# Patient Record
Sex: Male | Born: 1975
Health system: Southern US, Community
[De-identification: ages and names within clinical notes are randomized; demographics above are authoritative.]

---

## 2011-07-29 ENCOUNTER — Emergency Department (HOSPITAL_COMMUNITY): Payer: No Typology Code available for payment source

## 2011-07-29 ENCOUNTER — Emergency Department (HOSPITAL_COMMUNITY)
Admission: EM | Admit: 2011-07-29 | Discharge: 2011-07-29 | Disposition: A | Discharge status: Home / Self Care | Payer: No Typology Code available for payment source | Attending: Emergency Medicine | Admitting: Emergency Medicine

## 2011-07-29 ENCOUNTER — Encounter (HOSPITAL_COMMUNITY): Payer: Self-pay | Admitting: Emergency Medicine

## 2011-07-29 DIAGNOSIS — T07XXXA Unspecified multiple injuries, initial encounter: Secondary | ICD-10-CM

## 2011-07-29 DIAGNOSIS — M79609 Pain in unspecified limb: Secondary | ICD-10-CM | POA: Insufficient documentation

## 2011-07-29 DIAGNOSIS — IMO0002 Reserved for concepts with insufficient information to code with codable children: Secondary | ICD-10-CM | POA: Insufficient documentation

## 2011-07-29 DIAGNOSIS — S61409A Unspecified open wound of unspecified hand, initial encounter: Secondary | ICD-10-CM | POA: Insufficient documentation

## 2011-07-29 DIAGNOSIS — R404 Transient alteration of awareness: Secondary | ICD-10-CM | POA: Insufficient documentation

## 2011-07-29 LAB — SAMPLE TO BLOOD BANK

## 2011-07-29 LAB — POCT I-STAT, CHEM 8
BUN: 19 mg/dL (ref 6–23)
Calcium, Ion: 1.14 mmol/L (ref 1.12–1.32)
Chloride: 106 meq/L (ref 96–112)
Creatinine, Ser: 1.3 mg/dL (ref 0.50–1.35)
Glucose, Bld: 91 mg/dL (ref 70–99)
HCT: 47 % (ref 39.0–52.0)
Hemoglobin: 16 g/dL (ref 13.0–17.0)
Potassium: 4.4 meq/L (ref 3.5–5.1)
Sodium: 143 meq/L (ref 135–145)
TCO2: 28 mmol/L (ref 0–100)

## 2011-07-29 LAB — CBC
HCT: 43.1 % (ref 39.0–52.0)
Hemoglobin: 15.7 g/dL (ref 13.0–17.0)
MCH: 32.9 pg (ref 26.0–34.0)
MCHC: 36.4 g/dL — ABNORMAL HIGH (ref 30.0–36.0)
MCV: 90.4 fL (ref 78.0–100.0)
Platelets: 257 10*3/uL (ref 150–400)
RBC: 4.77 MIL/uL (ref 4.22–5.81)
RDW: 12.1 % (ref 11.5–15.5)
WBC: 9.4 10*3/uL (ref 4.0–10.5)

## 2011-07-29 LAB — LACTIC ACID, PLASMA: Lactic Acid, Venous: 1.8 mmol/L (ref 0.5–2.2)

## 2011-07-29 LAB — DIFFERENTIAL
Basophils Absolute: 0 10*3/uL (ref 0.0–0.1)
Basophils Relative: 0 % (ref 0–1)
Eosinophils Absolute: 0.1 10*3/uL (ref 0.0–0.7)
Monocytes Relative: 4 % (ref 3–12)
Neutro Abs: 5.9 10*3/uL (ref 1.7–7.7)
Neutrophils Relative %: 63 % (ref 43–77)

## 2011-07-29 LAB — PROTIME-INR
INR: 0.96 (ref 0.00–1.49)
Prothrombin Time: 13 s (ref 11.6–15.2)

## 2011-07-29 MED ORDER — IOHEXOL 300 MG/ML  SOLN
100.0000 mL | Freq: Once | INTRAMUSCULAR | Status: AC | PRN
  Administered 2011-07-29: 100 mL via INTRAVENOUS

## 2011-07-29 MED ORDER — HYDROCODONE-ACETAMINOPHEN 5-500 MG PO TABS: 2.0000 | ORAL_TABLET | Freq: Four times a day (QID) | ORAL | Status: AC | PRN

## 2011-07-29 MED ORDER — TETANUS-DIPHTH-ACELL PERTUSSIS 5-2.5-18.5 LF-MCG/0.5 IM SUSP
0.5000 mL | Freq: Once | INTRAMUSCULAR | Status: AC
  Administered 2011-07-29: 0.5 mL via INTRAMUSCULAR
  Filled 2011-07-29: qty 0.5

## 2011-07-29 MED ORDER — "THROMBI-PAD 3""X3"" EX PADS"
MEDICATED_PAD | CUTANEOUS | Status: AC
  Administered 2011-07-29: 06:00:00
  Filled 2011-07-29: qty 1

## 2011-07-29 NOTE — ED Notes (Signed)
Report given to kelly rn

## 2011-07-29 NOTE — ED Notes (Signed)
Patient resting family at bedside. Patient states he is comfortable at present. Alert oriented.
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
## 2017-08-08 ENCOUNTER — Ambulatory Visit: Payer: BLUE CROSS/BLUE SHIELD | Admitting: Family Medicine

## 2017-08-08 ENCOUNTER — Encounter: Payer: Self-pay | Admitting: Family Medicine

## 2017-08-08 VITALS — BP 114/64 | HR 78 | Temp 98.4°F | Ht 68.0 in | Wt 161.4 lb

## 2017-08-08 DIAGNOSIS — R55 Syncope and collapse: Secondary | ICD-10-CM | POA: Diagnosis not present

## 2017-08-08 NOTE — Progress Notes (Signed)
Subjective:  Cole Griffin is a 42 y.o. male who presents today with a chief complaint of syncope and to establish care.   HPI:  Syncope, New Issue Symptoms occurred early this morning. Went to sleep around 10 or 11pm. Woke up around 2 or 3 am to help his daughter get to the bathroom. When he was waiting for his daughter, he lost consciousness and woke up on the floor. Family tried to wake him up and it took him about a minute to regain consciousness. He then went back to bed. Felt "foggy headed" after that. No nausea or vomiting. No weakness or numbness. No headache.  No preceding symptoms.  No history of syncope.  No seizure-like activity.  No urinary incontinence.  No tongue biting.  No recent medications.  He had one beer yesterday but denies any other alcohol or drug use.  ROS: Per HPI, otherwise a 10 point review of systems was performed and was negative  PMH:  The following were reviewed and entered/updated in epic: History reviewed. No pertinent past medical history. There are no active problems to display for this patient.  History reviewed. No pertinent surgical history.  No family history of sudden cardiac death or syncope.  Medications- reviewed and updated No current outpatient medications on file.   No current facility-administered medications for this visit.    Allergies-reviewed and updated No Known Allergies  Social History   Socioeconomic History  . Marital status: Married    Spouse name: None  . Number of children: None  . Years of education: None  . Highest education level: None  Social Needs  . Financial resource strain: None  . Food insecurity - worry: None  . Food insecurity - inability: None  . Transportation needs - medical: None  . Transportation needs - non-medical: None  Occupational History  . None  Tobacco Use  . Smoking status: Never Smoker  . Smokeless tobacco: Never Used  Substance and Sexual Activity  . Alcohol use: Yes   Comment: Occasionally  . Drug use: No  . Sexual activity: Yes    Partners: Female  Other Topics Concern  . None  Social History Narrative  . None   Objective:  Physical Exam: BP 114/64 (BP Location: Left Arm, Patient Position: Sitting, Cuff Size: Normal)   Pulse 78   Temp 98.4 F (36.9 C) (Oral)   Ht 5' 8"  (1.727 m)   Wt 161 lb 6.4 oz (73.2 kg)   SpO2 97%   BMI 24.54 kg/m   Gen: NAD, resting comfortably HEENT: TMs clear bilaterally.  Moist mucous membranes. CV: RRR with no murmurs appreciated Pulm: NWOB, CTAB with no crackles, wheezes, or rhonchi GI: Normal bowel sounds present. Soft, Nontender, Nondistended. MSK: No edema, cyanosis, or clubbing noted Skin: Warm, dry Neuro: Cranial nerves II through XII intact.  Strength 5 out of 5 in upper and lower extremities.  Finger-nose-finger testing intact bilaterally.  No pronator drift.  Romberg negative.  Patellar reflexes 1+ and symmetric bilaterally. Psych: Normal affect and thought content  Assessment/Plan:  Syncope No clear etiology.  It is possible that he had a sleep cycle disturbance and went into a deep sleep stage of sleep however given his lack of prodromal symptoms, we need to rule out cardiac dysrhythmia.  We will place referral to cardiology to have prolonged monitoring.  Will defer further workup such as echocardiogram to cardiology.  No evidence of seizure like activity and his neurological exam is normal-doubt primary CNS lesion.  We will check CBC, CMET, and TSH.  Strict return precautions reviewed.  [name]. [name], MD 08/08/2017 4:52 PM

## 2017-08-08 NOTE — Patient Instructions (Signed)
Your symptoms are mostly likely due to a sleep disturbance.  I would like to make sure nothing else is going on with your heart.  We will check blood work today.  Come back to see me in 6-8 weeks for a physical exam, or sooner as needed.  Take care, Dr [name]

## 2017-08-09 LAB — COMPREHENSIVE METABOLIC PANEL
ALT: 14 U/L (ref 0–53)
AST: 20 U/L (ref 0–37)
Albumin: 4.6 g/dL (ref 3.5–5.2)
Alkaline Phosphatase: 43 U/L (ref 39–117)
BUN: 21 mg/dL (ref 6–23)
CHLORIDE: 103 meq/L (ref 96–112)
CO2: 23 meq/L (ref 19–32)
CREATININE: 0.86 mg/dL (ref 0.40–1.50)
Calcium: 9.3 mg/dL (ref 8.4–10.5)
GFR: 102.85 mL/min (ref >60.00)
Glucose, Bld: 92 mg/dL (ref 70–99)
POTASSIUM: 3.9 meq/L (ref 3.5–5.1)
Sodium: 142 mEq/L (ref 135–145)
Total Bilirubin: 0.6 mg/dL (ref 0.2–1.2)
Total Protein: 7 g/dL (ref 6.0–8.3)

## 2017-08-09 LAB — CBC
HEMATOCRIT: 43.7 % (ref 39.0–52.0)
Hemoglobin: 15 g/dL (ref 13.0–17.0)
MCHC: 34.4 g/dL (ref 30.0–36.0)
MCV: 93.7 fl (ref 78.0–100.0)
Platelets: 229 10*3/uL (ref 150.0–400.0)
RBC: 4.66 Mil/uL (ref 4.22–5.81)
RDW: 12.3 % (ref 11.5–15.5)
WBC: 7.7 10*3/uL (ref 4.0–10.5)

## 2017-08-09 LAB — TSH: TSH: 0.4 u[IU]/mL (ref 0.35–4.50)

## 2017-08-10 NOTE — Progress Notes (Signed)
Labs all normal.  Please inform patient.  No changes needed for his treatment plan.  We are waiting on him to see cardiology.

## 2017-09-12 ENCOUNTER — Ambulatory Visit (INDEPENDENT_AMBULATORY_CARE_PROVIDER_SITE_OTHER): Payer: BLUE CROSS/BLUE SHIELD | Admitting: Family Medicine

## 2017-09-12 ENCOUNTER — Encounter: Payer: Self-pay | Admitting: Family Medicine

## 2017-09-12 VITALS — BP 118/68 | HR 82 | Temp 98.4°F | Ht 68.0 in | Wt 161.2 lb

## 2017-09-12 DIAGNOSIS — Z114 Encounter for screening for human immunodeficiency virus [HIV]: Secondary | ICD-10-CM

## 2017-09-12 DIAGNOSIS — Z13220 Encounter for screening for lipoid disorders: Secondary | ICD-10-CM

## 2017-09-12 DIAGNOSIS — Z0000 Encounter for general adult medical examination without abnormal findings: Secondary | ICD-10-CM | POA: Diagnosis not present

## 2017-09-12 LAB — LIPID PANEL
CHOL/HDL RATIO: 3
CHOLESTEROL: 166 mg/dL (ref 0–200)
HDL: 54.9 mg/dL (ref >39.00)
LDL CALC: 91 mg/dL (ref 0–99)
NONHDL: 111.19
Triglycerides: 99 mg/dL (ref 0.0–149.0)
VLDL: 19.8 mg/dL (ref 0.0–40.0)

## 2017-09-12 NOTE — Patient Instructions (Signed)
Preventive Care 40-64 Years, Male Preventive care refers to lifestyle choices and visits with your health care provider that can promote health and wellness. What does preventive care include?  A yearly physical exam. This is also called an annual well check.  Dental exams once or twice a year.  Routine eye exams. Ask your health care provider how often you should have your eyes checked.  Personal lifestyle choices, including: ? Daily care of your teeth and gums. ? Regular physical activity. ? Eating a healthy diet. ? Avoiding tobacco and drug use. ? Limiting alcohol use. ? Practicing safe sex. ? Taking low-dose aspirin every day starting at age 44. What happens during an annual well check? The services and screenings done by your health care provider during your annual well check will depend on your age, overall health, lifestyle risk factors, and family history of disease. Counseling Your health care provider may ask you questions about your:  Alcohol use.  Tobacco use.  Drug use.  Emotional well-being.  Home and relationship well-being.  Sexual activity.  Eating habits.  Work and work Statistician.  Screening You may have the following tests or measurements:  Height, weight, and BMI.  Blood pressure.  Lipid and cholesterol levels. These may be checked every 5 years, or more frequently if you are over 44 years old.  Skin check.  Lung cancer screening. You may have this screening every year starting at age 44 if you have a 30-pack-year history of smoking and currently smoke or have quit within the past 15 years.  Fecal occult blood test (FOBT) of the stool. You may have this test every year starting at age 44.  Flexible sigmoidoscopy or colonoscopy. You may have a sigmoidoscopy every 5 years or a colonoscopy every 10 years starting at age 44.  Prostate cancer screening. Recommendations will vary depending on your family history and other risks.  Hepatitis C  blood test.  Hepatitis B blood test.  Sexually transmitted disease (STD) testing.  Diabetes screening. This is done by checking your blood sugar (glucose) after you have not eaten for a while (fasting). You may have this done every 1-3 years.  Discuss your test results, treatment options, and if necessary, the need for more tests with your health care provider. Vaccines Your health care provider may recommend certain vaccines, such as:  Influenza vaccine. This is recommended every year.  Tetanus, diphtheria, and acellular pertussis (Tdap, Td) vaccine. You may need a Td booster every 10 years.  Varicella vaccine. You may need this if you have not been vaccinated.  Zoster vaccine. You may need this after age 44.  Measles, mumps, and rubella (MMR) vaccine. You may need at least one dose of MMR if you were born in 1957 or later. You may also need a second dose.  Pneumococcal 13-valent conjugate (PCV13) vaccine. You may need this if you have certain conditions and have not been vaccinated.  Pneumococcal polysaccharide (PPSV23) vaccine. You may need one or two doses if you smoke cigarettes or if you have certain conditions.  Meningococcal vaccine. You may need this if you have certain conditions.  Hepatitis A vaccine. You may need this if you have certain conditions or if you travel or work in places where you may be exposed to hepatitis A.  Hepatitis B vaccine. You may need this if you have certain conditions or if you travel or work in places where you may be exposed to hepatitis B.  Haemophilus influenzae type b (Hib) vaccine.  You may need this if you have certain risk factors.  Talk to your health care provider about which screenings and vaccines you need and how often you need them. This information is not intended to replace advice given to you by your health care provider. Make sure you discuss any questions you have with your health care provider. Document Released: 07/31/2015  Document Revised: 03/23/2016 Document Reviewed: 05/05/2015 Elsevier Interactive Patient Education  [employer].

## 2017-09-12 NOTE — Progress Notes (Signed)
Subjective:  Cole Griffin is a 42 y.o. male who presents today for his annual comprehensive physical exam.    HPI:  He has no acute complaints today.   Lifestyle Diet: No specific diet. Tries to eat balanced diet.  Exercise: Runs 7 miles twice weekly. Rides bikes a couple times for per month.   Depression screen PHQ 2/9 08/08/2017  Decreased Interest 0  Down, Depressed, Hopeless 0  PHQ - 2 Score 0   Health Maintenance Due  Topic Date Due  . HIV Screening  12/22/1988    ROS: A complete review of systems was negative.   PMH:  The following were reviewed and entered/updated in epic: History reviewed. No pertinent past medical history. There are no active problems to display for this patient.  History reviewed. No pertinent surgical history.  Family History  Problem Relation Age of Onset  . Sudden Cardiac Death Neg Hx    Medications- reviewed and updated No current outpatient medications on file.   No current facility-administered medications for this visit.    Allergies-reviewed and updated No Known Allergies  Social History   Socioeconomic History  . Marital status: Married    Spouse name: None  . Number of children: None  . Years of education: None  . Highest education level: None  Social Needs  . Financial resource strain: None  . Food insecurity - worry: None  . Food insecurity - inability: None  . Transportation needs - medical: None  . Transportation needs - non-medical: None  Occupational History  . None  Tobacco Use  . Smoking status: Never Smoker  . Smokeless tobacco: Never Used  Substance and Sexual Activity  . Alcohol use: Yes    Comment: Occasionally  . Drug use: No  . Sexual activity: Yes    Partners: Female  Other Topics Concern  . None  Social History Narrative  . None   Objective:  Physical Exam: BP 118/68 (BP Location: Right Arm, Patient Position: Sitting, Cuff Size: Normal)   Pulse 82   Temp 98.4 F (36.9 C) (Oral)   Ht  5' 8"  (1.727 m)   Wt 161 lb 3.2 oz (73.1 kg)   SpO2 96%   BMI 24.51 kg/m   Body mass index is 24.51 kg/m. Wt Readings from Last 3 Encounters:  09/12/17 161 lb 3.2 oz (73.1 kg)  08/08/17 161 lb 6.4 oz (73.2 kg)   Gen: NAD, resting comfortably HEENT: TMs normal bilaterally. OP clear. No thyromegaly noted.  CV: RRR with no murmurs appreciated Pulm: NWOB, CTAB with no crackles, wheezes, or rhonchi GI: Normal bowel sounds present. Soft, Nontender, Nondistended. MSK: no edema, cyanosis, or clubbing noted Skin: warm, dry Neuro: CN2-12 grossly intact. Strength 5/5 in upper and lower extremities. Reflexes symmetric and intact bilaterally.  Psych: Normal affect and thought content  Assessment/Plan:  Preventative Healthcare: Check lipid panel and HIV antibody.   Patient Counseling:  -Nutrition: Stressed importance of moderation in sodium/caffeine intake, saturated fat and cholesterol, caloric balance, sufficient intake of fresh fruits, vegetables, and fiber.  -Stressed the importance of regular exercise.   -Substance Abuse: Discussed cessation/primary prevention of tobacco, alcohol, or other drug use; driving or other dangerous activities under the influence; availability of treatment for abuse.   -Injury prevention: Discussed safety belts, safety helmets, smoke detector, smoking near bedding or upholstery.   -Sexuality: Discussed sexually transmitted diseases, partner selection, use of condoms, avoidance of unintended pregnancy and contraceptive alternatives.   -Dental health: Discussed importance of regular tooth  brushing, flossing, and dental visits.  -Health maintenance and immunizations reviewed. Please refer to Health maintenance section.  Return to care in 1 year for next preventative visit.   [name]. [name], MD 09/12/2017 8:49 AM

## 2017-09-13 LAB — HIV ANTIBODY (ROUTINE TESTING W REFLEX): HIV 1&2 Ab, 4th Generation: NONREACTIVE

## 2017-09-14 NOTE — Progress Notes (Signed)
Dr Marigene Ehlers interpretation of your lab work:  Your HIV test and hepatitis C tests are negative.    If you have any additional questions, please give [place] a call or send [place] a message through [employer].  Take care, Dr [name]

## 2018-09-13 ENCOUNTER — Encounter: Payer: BLUE CROSS/BLUE SHIELD | Admitting: Family Medicine

## 2018-09-19 ENCOUNTER — Ambulatory Visit (INDEPENDENT_AMBULATORY_CARE_PROVIDER_SITE_OTHER): Payer: 59 | Admitting: Family Medicine

## 2018-09-19 ENCOUNTER — Encounter: Payer: Self-pay | Admitting: Family Medicine

## 2018-09-19 VITALS — BP 118/72 | HR 63 | Temp 98.4°F | Ht 68.0 in | Wt 159.0 lb

## 2018-09-19 DIAGNOSIS — Z0000 Encounter for general adult medical examination without abnormal findings: Secondary | ICD-10-CM

## 2018-09-19 NOTE — Patient Instructions (Signed)
It was very nice to see you today!  Keep up the good work!  Come back in 1 year for your next physical or sooner as needed.  Take care, Dr [name]    Preventive Care 40-64 Years, Male Preventive care refers to lifestyle choices and visits with your health care provider that can promote health and wellness. What does preventive care include?   A yearly physical exam. This is also called an annual well check.  Dental exams once or twice a year.  Routine eye exams. Ask your health care provider how often you should have your eyes checked.  Personal lifestyle choices, including: ? Daily care of your teeth and gums. ? Regular physical activity. ? Eating a healthy diet. ? Avoiding tobacco and drug use. ? Limiting alcohol use. ? Practicing safe sex. ? Taking low-dose aspirin every day starting at age 45. What happens during an annual well check? The services and screenings done by your health care provider during your annual well check will depend on your age, overall health, lifestyle risk factors, and family history of disease. Counseling Your health care provider may ask you questions about your:  Alcohol use.  Tobacco use.  Drug use.  Emotional well-being.  Home and relationship well-being.  Sexual activity.  Eating habits.  Work and work Statistician. Screening You may have the following tests or measurements:  Height, weight, and BMI.  Blood pressure.  Lipid and cholesterol levels. These may be checked every 5 years, or more frequently if you are over 45 years old.  Skin check.  Lung cancer screening. You may have this screening every year starting at age 45 if you have a 30-pack-year history of smoking and currently smoke or have quit within the past 15 years.  Colorectal cancer screening. All adults should have this screening starting at age 45 and continuing until age 29. Your health care provider may recommend screening at age 59. You will have tests  every 1-10 years, depending on your results and the type of screening test. People at increased risk should start screening at an earlier age. Screening tests may include: ? Guaiac-based fecal occult blood testing. ? Fecal immunochemical test (FIT). ? Stool DNA test. ? Virtual colonoscopy. ? Sigmoidoscopy. During this test, a flexible tube with a tiny camera (sigmoidoscope) is used to examine your rectum and lower colon. The sigmoidoscope is inserted through your anus into your rectum and lower colon. ? Colonoscopy. During this test, a long, thin, flexible tube with a tiny camera (colonoscope) is used to examine your entire colon and rectum.  Prostate cancer screening. Recommendations will vary depending on your family history and other risks.  Hepatitis C blood test.  Hepatitis B blood test.  Sexually transmitted disease (STD) testing.  Diabetes screening. This is done by checking your blood sugar (glucose) after you have not eaten for a while (fasting). You may have this done every 1-3 years. Discuss your test results, treatment options, and if necessary, the need for more tests with your health care provider. Vaccines Your health care provider may recommend certain vaccines, such as:  Influenza vaccine. This is recommended every year.  Tetanus, diphtheria, and acellular pertussis (Tdap, Td) vaccine. You may need a Td booster every 10 years.  Varicella vaccine. You may need this if you have not been vaccinated.  Zoster vaccine. You may need this after age 45.  Measles, mumps, and rubella (MMR) vaccine. You may need at least one dose of MMR if you were born  in 1957 or later. You may also need a second dose.  Pneumococcal 13-valent conjugate (PCV13) vaccine. You may need this if you have certain conditions and have not been vaccinated.  Pneumococcal polysaccharide (PPSV23) vaccine. You may need one or two doses if you smoke cigarettes or if you have certain  conditions.  Meningococcal vaccine. You may need this if you have certain conditions.  Hepatitis A vaccine. You may need this if you have certain conditions or if you travel or work in places where you may be exposed to hepatitis A.  Hepatitis B vaccine. You may need this if you have certain conditions or if you travel or work in places where you may be exposed to hepatitis B.  Haemophilus influenzae type b (Hib) vaccine. You may need this if you have certain risk factors. Talk to your health care provider about which screenings and vaccines you need and how often you need them. This information is not intended to replace advice given to you by your health care provider. Make sure you discuss any questions you have with your health care provider. Document Released: 07/31/2015 Document Revised: 08/24/2017 Document Reviewed: 05/05/2015 Elsevier Interactive Patient Education  2019 [employer].

## 2018-09-19 NOTE — Progress Notes (Signed)
Chief Complaint:  Cole Griffin is a 43 y.o. male who presents today for his annual comprehensive physical exam.    Assessment/Plan:  Healthy 43 year old man doing well.   Preventative Healthcare: UTD on vaccines and screenings.   Patient Counseling(The following topics were reviewed and/or handout was given):  -Nutrition: Stressed importance of moderation in sodium/caffeine intake, saturated fat and cholesterol, caloric balance, sufficient intake of fresh fruits, vegetables, and fiber.  -Stressed the importance of regular exercise.   -Substance Abuse: Discussed cessation/primary prevention of tobacco, alcohol, or other drug use; driving or other dangerous activities under the influence; availability of treatment for abuse.   -Injury prevention: Discussed safety belts, safety helmets, smoke detector, smoking near bedding or upholstery.   -Sexuality: Discussed sexually transmitted diseases, partner selection, use of condoms, avoidance of unintended pregnancy and contraceptive alternatives.   -Dental health: Discussed importance of regular tooth brushing, flossing, and dental visits.  -Health maintenance and immunizations reviewed. Please refer to Health maintenance section.  Return to care in 1 year for next preventative visit.     Subjective:  HPI:  He has no acute complaints today.   Lifestyle Diet: Tries to eat a healthy, balanced diet.  Exercise: Runs about 12 miles per week.   Depression screen [facility] 2/9 09/19/2018  Decreased Interest 0  Down, Depressed, Hopeless 0  PHQ - 2 Score 0   ROS: A complete review of systems was negative.   PMH:  The following were reviewed and entered/updated in epic: History reviewed. No pertinent past medical history. There are no active problems to display for this patient.  History reviewed. No pertinent surgical history.  Family History  Problem Relation Age of Onset  . Sudden Cardiac Death Neg Hx    Medications- reviewed and  updated No current outpatient medications on file.   No current facility-administered medications for this visit.    Allergies-reviewed and updated No Known Allergies  Social History   Socioeconomic History  . Marital status: Married    Spouse name: Not on file  . Number of children: Not on file  . Years of education: Not on file  . Highest education level: Not on file  Occupational History  . Not on file  Social Needs  . Financial resource strain: Not on file  . Food insecurity:    Worry: Not on file    Inability: Not on file  . Transportation needs:    Medical: Not on file    Non-medical: Not on file  Tobacco Use  . Smoking status: Never Smoker  . Smokeless tobacco: Never Used  Substance and Sexual Activity  . Alcohol use: Yes    Comment: Occasionally  . Drug use: No  . Sexual activity: Yes    Partners: Female  Lifestyle  . Physical activity:    Days per week: Not on file    Minutes per session: Not on file  . Stress: Not on file  Relationships  . Social connections:    Talks on phone: Not on file    Gets together: Not on file    Attends religious service: Not on file    Active member of club or organization: Not on file    Attends meetings of clubs or organizations: Not on file    Relationship status: Not on file  Other Topics Concern  . Not on file  Social History Narrative  . Not on file        Objective:  Physical Exam: BP 118/72 (  BP Location: Left Arm, Patient Position: Sitting, Cuff Size: Normal)   Pulse 63   Temp 98.4 F (36.9 C) (Oral)   Ht 5' 8"  (1.727 m)   Wt 159 lb (72.1 kg)   SpO2 99%   BMI 24.18 kg/m   Body mass index is 24.18 kg/m. Wt Readings from Last 3 Encounters:  09/19/18 159 lb (72.1 kg)  09/12/17 161 lb 3.2 oz (73.1 kg)  08/08/17 161 lb 6.4 oz (73.2 kg)   Gen: NAD, resting comfortably HEENT: TMs normal bilaterally. OP clear. No thyromegaly noted.  CV: RRR with no murmurs appreciated Pulm: NWOB, CTAB with no crackles,  wheezes, or rhonchi GI: Normal bowel sounds present. Soft, Nontender, Nondistended. MSK: no edema, cyanosis, or clubbing noted Skin: warm, dry Neuro: CN2-12 grossly intact. Strength 5/5 in upper and lower extremities. Reflexes symmetric and intact bilaterally.  Psych: Normal affect and thought content     Caleb M. [name], MD 09/19/2018 9:17 AM

## 2019-09-25 ENCOUNTER — Other Ambulatory Visit: Payer: Self-pay

## 2019-09-25 ENCOUNTER — Ambulatory Visit (INDEPENDENT_AMBULATORY_CARE_PROVIDER_SITE_OTHER): Payer: 59 | Admitting: Family Medicine

## 2019-09-25 ENCOUNTER — Encounter: Payer: Self-pay | Admitting: Family Medicine

## 2019-09-25 VITALS — BP 106/68 | HR 63 | Temp 97.6°F | Ht 68.0 in | Wt 156.2 lb

## 2019-09-25 DIAGNOSIS — Z1211 Encounter for screening for malignant neoplasm of colon: Secondary | ICD-10-CM

## 2019-09-25 DIAGNOSIS — Z0000 Encounter for general adult medical examination without abnormal findings: Secondary | ICD-10-CM | POA: Diagnosis not present

## 2019-09-25 NOTE — Progress Notes (Signed)
Chief Complaint:  Cole Griffin is a 44 y.o. male who presents today for his annual comprehensive physical exam.    Assessment/Plan:  Healthy 44 year old male.   Preventative Healthcare: We will order Cologuard for colon cancer screening.  Up-to-date on other vaccines and screenings.  Patient Counseling(The following topics were reviewed and/or handout was given):  -Nutrition: Stressed importance of moderation in sodium/caffeine intake, saturated fat and cholesterol, caloric balance, sufficient intake of fresh fruits, vegetables, and fiber.  -Stressed the importance of regular exercise.   -Substance Abuse: Discussed cessation/primary prevention of tobacco, alcohol, or other drug use; driving or other dangerous activities under the influence; availability of treatment for abuse.   -Injury prevention: Discussed safety belts, safety helmets, smoke detector, smoking near bedding or upholstery.   -Sexuality: Discussed sexually transmitted diseases, partner selection, use of condoms, avoidance of unintended pregnancy and contraceptive alternatives.   -Dental health: Discussed importance of regular tooth brushing, flossing, and dental visits.  -Health maintenance and immunizations reviewed. Please refer to Health maintenance section.  Return to care in 1 year for next preventative visit.     Subjective:  HPI:  He has no acute complaints today.   Lifestyle Diet: Balanced. Plenty of fruits and vegetables. Intermittent fasting.  Exercise: Runs daily.   Depression screen PHQ 2/9 09/19/2018  Decreased Interest 0  Down, Depressed, Hopeless 0  PHQ - 2 Score 0   There are no preventive care reminders to display for this patient.   ROS: A complete review of systems was negative.   PMH:  The following were reviewed and entered/updated in epic: History reviewed. No pertinent past medical history. There are no problems to display for this patient.  History reviewed. No pertinent surgical  history.  Family History  Problem Relation Age of Onset  . Sudden Cardiac Death Neg Hx     Medications- reviewed and updated No current outpatient medications on file.   No current facility-administered medications for this visit.    Allergies-reviewed and updated No Known Allergies  Social History   Socioeconomic History  . Marital status: Married    Spouse name: Not on file  . Number of children: Not on file  . Years of education: Not on file  . Highest education level: Not on file  Occupational History  . Not on file  Tobacco Use  . Smoking status: Never Smoker  . Smokeless tobacco: Never Used  Substance and Sexual Activity  . Alcohol use: Yes    Comment: Occasionally  . Drug use: No  . Sexual activity: Yes    Partners: Female  Other Topics Concern  . Not on file  Social History Narrative  . Not on file   Social Determinants of Health   Financial Resource Strain:   . Difficulty of Paying Living Expenses: Not on file  Food Insecurity:   . Worried About Charity fundraiser in the Last Year: Not on file  . Ran Out of Food in the Last Year: Not on file  Transportation Needs:   . Lack of Transportation (Medical): Not on file  . Lack of Transportation (Non-Medical): Not on file  Physical Activity:   . Days of Exercise per Week: Not on file  . Minutes of Exercise per Session: Not on file  Stress:   . Feeling of Stress : Not on file  Social Connections:   . Frequency of Communication with Friends and Family: Not on file  . Frequency of Social Gatherings with Friends and  Family: Not on file  . Attends Religious Services: Not on file  . Active Member of Clubs or Organizations: Not on file  . Attends Archivist Meetings: Not on file  . Marital Status: Not on file        Objective:  Physical Exam: BP 106/68   Pulse 63   Temp 97.6 F (36.4 C)   Ht 5' 8"  (1.727 m)   Wt 156 lb 4 oz (70.9 kg)   SpO2 98%   BMI 23.76 kg/m   Body mass index is  23.76 kg/m. Wt Readings from Last 3 Encounters:  09/25/19 156 lb 4 oz (70.9 kg)  09/19/18 159 lb (72.1 kg)  09/12/17 161 lb 3.2 oz (73.1 kg)   Gen: NAD, resting comfortably HEENT: TMs normal bilaterally. OP clear. No thyromegaly noted.  CV: RRR with no murmurs appreciated Pulm: NWOB, CTAB with no crackles, wheezes, or rhonchi GI: Normal bowel sounds present. Soft, Nontender, Nondistended. MSK: no edema, cyanosis, or clubbing noted Skin: warm, dry Neuro: CN2-12 grossly intact. Strength 5/5 in upper and lower extremities. Reflexes symmetric and intact bilaterally.  Psych: Normal affect and thought content     Kerstin Crusoe M. [name], MD 09/25/2019 9:14 AM

## 2019-09-25 NOTE — Patient Instructions (Addendum)
It was very nice to see you today!  We will get you set up to have a cologuard test.   Come back in 1 year for your next physical, or sooner if needed.   Take care, Dr [name]  Please try these tips to maintain a healthy lifestyle:   Eat at least 3 REAL meals and 1-2 snacks per day.  Aim for no more than 5 hours between eating.  If you eat breakfast, please do so within one hour of getting up.    Each meal should contain half fruits/vegetables, one quarter protein, and one quarter carbs (no bigger than a computer mouse)   Cut down on sweet beverages. This includes juice, soda, and sweet tea.     Drink at least 1 glass of water with each meal and aim for at least 8 glasses per day   Exercise at least 150 minutes every week.    Preventive Care 3-69 Years Old, Male Preventive care refers to lifestyle choices and visits with your health care provider that can promote health and wellness. This includes:  A yearly physical exam. This is also called an annual well check.  Regular dental and eye exams.  Immunizations.  Screening for certain conditions.  Healthy lifestyle choices, such as eating a healthy diet, getting regular exercise, not using drugs or products that contain nicotine and tobacco, and limiting alcohol use. What can I expect for my preventive care visit? Physical exam Your health care provider will check:  Height and weight. These may be used to calculate body mass index (BMI), which is a measurement that tells if you are at a healthy weight.  Heart rate and blood pressure.  Your skin for abnormal spots. Counseling Your health care provider may ask you questions about:  Alcohol, tobacco, and drug use.  Emotional well-being.  Home and relationship well-being.  Sexual activity.  Eating habits.  Work and work Statistician. What immunizations do I need?  Influenza (flu) vaccine  This is recommended every year. Tetanus, diphtheria, and pertussis  (Tdap) vaccine  You may need a Td booster every 10 years. Varicella (chickenpox) vaccine  You may need this vaccine if you have not already been vaccinated. Zoster (shingles) vaccine  You may need this after age 27. Measles, mumps, and rubella (MMR) vaccine  You may need at least one dose of MMR if you were born in 1957 or later. You may also need a second dose. Pneumococcal conjugate (PCV13) vaccine  You may need this if you have certain conditions and were not previously vaccinated. Pneumococcal polysaccharide (PPSV23) vaccine  You may need one or two doses if you smoke cigarettes or if you have certain conditions. Meningococcal conjugate (MenACWY) vaccine  You may need this if you have certain conditions. Hepatitis A vaccine  You may need this if you have certain conditions or if you travel or work in places where you may be exposed to hepatitis A. Hepatitis B vaccine  You may need this if you have certain conditions or if you travel or work in places where you may be exposed to hepatitis B. Haemophilus influenzae type b (Hib) vaccine  You may need this if you have certain risk factors. Human papillomavirus (HPV) vaccine  If recommended by your health care provider, you may need three doses over 6 months. You may receive vaccines as individual doses or as more than one vaccine together in one shot (combination vaccines). Talk with your health care provider about the risks and benefits  of combination vaccines. What tests do I need? Blood tests  Lipid and cholesterol levels. These may be checked every 5 years, or more frequently if you are over 52 years old.  Hepatitis C test.  Hepatitis B test. Screening  Lung cancer screening. You may have this screening every year starting at age 89 if you have a 30-pack-year history of smoking and currently smoke or have quit within the past 15 years.  Prostate cancer screening. Recommendations will vary depending on your family  history and other risks.  Colorectal cancer screening. All adults should have this screening starting at age 40 and continuing until age 38. Your health care provider may recommend screening at age 39 if you are at increased risk. You will have tests every 1-10 years, depending on your results and the type of screening test.  Diabetes screening. This is done by checking your blood sugar (glucose) after you have not eaten for a while (fasting). You may have this done every 1-3 years.  Sexually transmitted disease (STD) testing. Follow these instructions at home: Eating and drinking  Eat a diet that includes fresh fruits and vegetables, whole grains, lean protein, and low-fat dairy products.  Take vitamin and mineral supplements as recommended by your health care provider.  Do not drink alcohol if your health care provider tells you not to drink.  If you drink alcohol: ? Limit how much you have to 0-2 drinks a day. ? Be aware of how much alcohol is in your drink. In the [place] one drink equals one 12 oz bottle of beer (355 mL), one 5 oz glass of wine (148 mL), or one 1 oz glass of hard liquor (44 mL). Lifestyle  Take daily care of your teeth and gums.  Stay active. Exercise for at least 30 minutes on 5 or more days each week.  Do not use any products that contain nicotine or tobacco, such as cigarettes, e-cigarettes, and chewing tobacco. If you need help quitting, ask your health care provider.  If you are sexually active, practice safe sex. Use a condom or other form of protection to prevent STIs (sexually transmitted infections).  Talk with your health care provider about taking a low-dose aspirin every day starting at age 48. What's next?  Go to your health care provider once a year for a well check visit.  Ask your health care provider how often you should have your eyes and teeth checked.  Stay up to date on all vaccines. This information is not intended to replace advice  given to you by your health care provider. Make sure you discuss any questions you have with your health care provider. Document Revised: 06/28/2018 Document Reviewed: 06/28/2018 Elsevier Patient Education  2020 [employer].

## 2019-10-15 ENCOUNTER — Telehealth: Payer: Self-pay

## 2019-10-15 ENCOUNTER — Encounter: Payer: Self-pay | Admitting: Family Medicine

## 2019-10-15 LAB — COLOGUARD
COLOGUARD: NEGATIVE
Cologuard: NEGATIVE

## 2019-10-15 NOTE — Telephone Encounter (Signed)
Cologuard Negative

## 2019-10-15 NOTE — Telephone Encounter (Signed)
Patient notified

## 2020-05-25 ENCOUNTER — Ambulatory Visit: Payer: 59 | Admitting: Family Medicine

## 2020-06-08 ENCOUNTER — Ambulatory Visit: Payer: 59 | Admitting: Family Medicine

## 2020-06-08 ENCOUNTER — Other Ambulatory Visit: Payer: Self-pay

## 2020-06-08 ENCOUNTER — Encounter: Payer: Self-pay | Admitting: Family Medicine

## 2020-06-08 VITALS — BP 103/67 | HR 60 | Temp 97.9°F | Ht 68.0 in | Wt 159.4 lb

## 2020-06-08 DIAGNOSIS — R350 Frequency of micturition: Secondary | ICD-10-CM

## 2020-06-08 DIAGNOSIS — Z125 Encounter for screening for malignant neoplasm of prostate: Secondary | ICD-10-CM | POA: Diagnosis not present

## 2020-06-08 NOTE — Patient Instructions (Signed)
It was very nice to see you today!  I do not think you have prostate cancer. We will check labs to make sure nothing else is going on.  Take care, Dr [name]  Please try these tips to maintain a healthy lifestyle:   Eat at least 3 REAL meals and 1-2 snacks per day.  Aim for no more than 5 hours between eating.  If you eat breakfast, please do so within one hour of getting up.    Each meal should contain half fruits/vegetables, one quarter protein, and one quarter carbs (no bigger than a computer mouse)   Cut down on sweet beverages. This includes juice, soda, and sweet tea.     Drink at least 1 glass of water with each meal and aim for at least 8 glasses per day   Exercise at least 150 minutes every week.

## 2020-06-08 NOTE — Progress Notes (Signed)
   Keawe Marcello is a 44 y.o. male who presents today for an office visit.  Assessment/Plan:  Urinary frequency No red flags.  Will check PSA and UA to rule out prostate cancer and UTI or other possible etiologies..  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    Subjective:  HPI:  More increased urinary frequency. He thinks it is due to polydipsia. Urinates every 2 hours. Does not wake up at night to urinate. No dysuria.        Objective:  Physical Exam: BP 103/67   Pulse 60   Temp 97.9 F (36.6 C) (Temporal)   Ht 5' 8"  (1.727 m)   Wt 159 lb 6.4 oz (72.3 kg)   SpO2 99%   BMI 24.24 kg/m   Gen: No acute distress, resting comfortably Neuro: Grossly normal, moves all extremities Psych: Normal affect and thought content      Tyreece Gelles M. [name], MD 06/08/2020 8:13 AM

## 2020-06-15 ENCOUNTER — Other Ambulatory Visit: Payer: 59

## 2020-09-30 ENCOUNTER — Ambulatory Visit (INDEPENDENT_AMBULATORY_CARE_PROVIDER_SITE_OTHER): Payer: 59 | Admitting: Family Medicine

## 2020-09-30 ENCOUNTER — Other Ambulatory Visit: Payer: Self-pay

## 2020-09-30 ENCOUNTER — Encounter: Payer: Self-pay | Admitting: Family Medicine

## 2020-09-30 VITALS — BP 110/72 | HR 74 | Temp 97.8°F | Ht 68.0 in | Wt 158.2 lb

## 2020-09-30 DIAGNOSIS — Z0001 Encounter for general adult medical examination with abnormal findings: Secondary | ICD-10-CM

## 2020-09-30 DIAGNOSIS — J3489 Other specified disorders of nose and nasal sinuses: Secondary | ICD-10-CM

## 2020-09-30 MED ORDER — AZELASTINE HCL 0.1 % NA SOLN: 2.0000 | Freq: Two times a day (BID) | NASAL | 12 refills | Status: DC

## 2020-09-30 NOTE — Progress Notes (Signed)
Chief Complaint:  Cole Griffin is a 45 y.o. male who presents today for his annual comprehensive physical exam.    Assessment/Plan:  New/Acute Problems: Rhinorrhea Likely seasonal allergies.  Home Covid test negative.  No red flags.  Start Astelin nasal spray.  Discussed reasons to return to care.  Preventative Healthcare: UTD on vaccines and screenings. Cologuard negative last year. Will repeat labs next year and cologuard in 2024.   Patient Counseling(The following topics were reviewed and/or handout was given):  -Nutrition: Stressed importance of moderation in sodium/caffeine intake, saturated fat and cholesterol, caloric balance, sufficient intake of fresh fruits, vegetables, and fiber.  -Stressed the importance of regular exercise.   -Substance Abuse: Discussed cessation/primary prevention of tobacco, alcohol, or other drug use; driving or other dangerous activities under the influence; availability of treatment for abuse.   -Injury prevention: Discussed safety belts, safety helmets, smoke detector, smoking near bedding or upholstery.   -Sexuality: Discussed sexually transmitted diseases, partner selection, use of condoms, avoidance of unintended pregnancy and contraceptive alternatives.   -Dental health: Discussed importance of regular tooth brushing, flossing, and dental visits.  -Health maintenance and immunizations reviewed. Please refer to Health maintenance section.  Return to care in 1 year for next preventative visit.     Subjective:  HPI:  He has no acute complaints today.   Lifestyle Diet: Balanced. Plenty of fruits and vegetables. Intermittent fasting.  Exercise: Likes running.   Depression screen PHQ 2/9 06/08/2020  Decreased Interest 0  Down, Depressed, Hopeless 0  PHQ - 2 Score 0    Health Maintenance Due  Topic Date Due  . Hepatitis C Screening  Never done  . COVID-19 Vaccine (2 - Pfizer 3-dose series) 08/06/2020     ROS: Per HPI, otherwise a  complete review of systems was negative.   PMH:  The following were reviewed and entered/updated in epic: History reviewed. No pertinent past medical history. There are no problems to display for this patient.  History reviewed. No pertinent surgical history.  Family History  Problem Relation Age of Onset  . Sudden Cardiac Death Neg Hx     Medications- reviewed and updated Current Outpatient Medications  Medication Sig Dispense Refill  . azelastine (ASTELIN) 0.1 % nasal spray Place 2 sprays into both nostrils 2 (two) times daily. 30 mL 12   No current facility-administered medications for this visit.    Allergies-reviewed and updated No Known Allergies  Social History   Socioeconomic History  . Marital status: Married    Spouse name: Not on file  . Number of children: Not on file  . Years of education: Not on file  . Highest education level: Not on file  Occupational History  . Not on file  Tobacco Use  . Smoking status: Never Smoker  . Smokeless tobacco: Never Used  Substance and Sexual Activity  . Alcohol use: Yes    Comment: Occasionally  . Drug use: No  . Sexual activity: Yes    Partners: Female  Other Topics Concern  . Not on file  Social History Narrative  . Not on file   Social Determinants of Health   Financial Resource Strain: Not on file  Food Insecurity: Not on file  Transportation Needs: Not on file  Physical Activity: Not on file  Stress: Not on file  Social Connections: Not on file        Objective:  Physical Exam: BP 110/72   Pulse 74   Temp 97.8 F (36.6 C) (Temporal)  Ht 5' 8"  (1.727 m)   Wt 158 lb 3.2 oz (71.8 kg)   SpO2 98%   BMI 24.05 kg/m   Body mass index is 24.05 kg/m. Wt Readings from Last 3 Encounters:  09/30/20 158 lb 3.2 oz (71.8 kg)  06/08/20 159 lb 6.4 oz (72.3 kg)  09/25/19 156 lb 4 oz (70.9 kg)   Gen: NAD, resting comfortably HEENT: TMs normal bilaterally. OP clear. No thyromegaly noted.  CV: RRR with no  murmurs appreciated Pulm: NWOB, CTAB with no crackles, wheezes, or rhonchi GI: Normal bowel sounds present. Soft, Nontender, Nondistended. MSK: no edema, cyanosis, or clubbing noted Skin: warm, dry Neuro: CN2-12 grossly intact. Strength 5/5 in upper and lower extremities. Reflexes symmetric and intact bilaterally.  Psych: Normal affect and thought content     Cole Griffin M. [name], MD 09/30/2020 9:05 AM

## 2020-09-30 NOTE — Patient Instructions (Addendum)
It was very nice to see you today!  Please try the nasal spray.  I will see you back in 1 year for your next annual physical. Come back to see me sooner if needed.   Take care, Dr [name]  PLEASE NOTE:  If you had any lab tests please let us know if you have not heard back within a few days. You may see your results on mychart before we have a chance to review them but we will give you a call once they are reviewed by [place]. If we ordered any referrals today, please let us know if you have not heard from their office within the next week.   Please try these tips to maintain a healthy lifestyle:   Eat at least 3 REAL meals and 1-2 snacks per day.  Aim for no more than 5 hours between eating.  If you eat breakfast, please do so within one hour of getting up.    Each meal should contain half fruits/vegetables, one quarter protein, and one quarter carbs (no bigger than a computer mouse)   Cut down on sweet beverages. This includes juice, soda, and sweet tea.     Drink at least 1 glass of water with each meal and aim for at least 8 glasses per day   Exercise at least 150 minutes every week.    Preventive Care 22-72 Years Old, Male Preventive care refers to lifestyle choices and visits with your health care provider that can promote health and wellness. This includes:  A yearly physical exam. This is also called an annual wellness visit.  Regular dental and eye exams.  Immunizations.  Screening for certain conditions.  Healthy lifestyle choices, such as: ? Eating a healthy diet. ? Getting regular exercise. ? Not using drugs or products that contain nicotine and tobacco. ? Limiting alcohol use. What can I expect for my preventive care visit? Physical exam Your health care provider will check your:  Height and weight. These may be used to calculate your BMI (body mass index). BMI is a measurement that tells if you are at a healthy weight.  Heart rate and blood  pressure.  Body temperature.  Skin for abnormal spots. Counseling Your health care provider may ask you questions about your:  Past medical problems.  Family's medical history.  Alcohol, tobacco, and drug use.  Emotional well-being.  Home life and relationship well-being.  Sexual activity.  Diet, exercise, and sleep habits.  Work and work Statistician.  Access to firearms. What immunizations do I need? Vaccines are usually given at various ages, according to a schedule. Your health care provider will recommend vaccines for you based on your age, medical history, and lifestyle or other factors, such as travel or where you work.   What tests do I need? Blood tests  Lipid and cholesterol levels. These may be checked every 5 years, or more often if you are over 86 years old.  Hepatitis C test.  Hepatitis B test. Screening  Lung cancer screening. You may have this screening every year starting at age 20 if you have a 30-pack-year history of smoking and currently smoke or have quit within the past 15 years.  Prostate cancer screening. Recommendations will vary depending on your family history and other risks.  Genital exam to check for testicular cancer or hernias.  Colorectal cancer screening. ? All adults should have this screening starting at age 79 and continuing until age 6. ? Your health care provider may recommend  screening at age 7 if you are at increased risk. ? You will have tests every 1-10 years, depending on your results and the type of screening test.  Diabetes screening. ? This is done by checking your blood sugar (glucose) after you have not eaten for a while (fasting). ? You may have this done every 1-3 years.  STD (sexually transmitted disease) testing, if you are at risk. Follow these instructions at home: Eating and drinking  Eat a diet that includes fresh fruits and vegetables, whole grains, lean protein, and low-fat dairy products.  Take  vitamin and mineral supplements as recommended by your health care provider.  Do not drink alcohol if your health care provider tells you not to drink.  If you drink alcohol: ? Limit how much you have to 0-2 drinks a day. ? Be aware of how much alcohol is in your drink. In the [place] one drink equals one 12 oz bottle of beer (355 mL), one 5 oz glass of wine (148 mL), or one 1 oz glass of hard liquor (44 mL).   Lifestyle  Take daily care of your teeth and gums. Brush your teeth every morning and night with fluoride toothpaste. Floss one time each day.  Stay active. Exercise for at least 30 minutes 5 or more days each week.  Do not use any products that contain nicotine or tobacco, such as cigarettes, e-cigarettes, and chewing tobacco. If you need help quitting, ask your health care provider.  Do not use drugs.  If you are sexually active, practice safe sex. Use a condom or other form of protection to prevent STIs (sexually transmitted infections).  If told by your health care provider, take low-dose aspirin daily starting at age 72.  Find healthy ways to cope with stress, such as: ? Meditation, yoga, or listening to music. ? Journaling. ? Talking to a trusted person. ? Spending time with friends and family. Safety  Always wear your seat belt while driving or riding in a vehicle.  Do not drive: ? If you have been drinking alcohol. Do not ride with someone who has been drinking. ? When you are tired or distracted. ? While texting.  Wear a helmet and other protective equipment during sports activities.  If you have firearms in your house, make sure you follow all gun safety procedures. What's next?  Go to your health care provider once a year for an annual wellness visit.  Ask your health care provider how often you should have your eyes and teeth checked.  Stay up to date on all vaccines. This information is not intended to replace advice given to you by your health care  provider. Make sure you discuss any questions you have with your health care provider. Document Revised: 04/02/2019 Document Reviewed: 06/28/2018 Elsevier Patient Education  2021 [employer].

## 2021-10-01 ENCOUNTER — Encounter: Payer: 59 | Admitting: Family Medicine

## 2021-10-04 ENCOUNTER — Encounter: Payer: Self-pay | Admitting: Family Medicine

## 2021-10-04 ENCOUNTER — Ambulatory Visit (INDEPENDENT_AMBULATORY_CARE_PROVIDER_SITE_OTHER): Payer: 59 | Admitting: Family Medicine

## 2021-10-04 VITALS — BP 129/84 | HR 63 | Temp 98.1°F | Ht 68.0 in | Wt 166.6 lb

## 2021-10-04 DIAGNOSIS — Z1159 Encounter for screening for other viral diseases: Secondary | ICD-10-CM | POA: Diagnosis not present

## 2021-10-04 DIAGNOSIS — Z0001 Encounter for general adult medical examination with abnormal findings: Secondary | ICD-10-CM

## 2021-10-04 DIAGNOSIS — R94120 Abnormal auditory function study: Secondary | ICD-10-CM

## 2021-10-04 NOTE — Progress Notes (Signed)
? ?Chief Complaint:  ?Cole Griffin is a 46 y.o. male who presents today for his annual comprehensive physical exam.   ? ?Assessment/Plan:  ?New/Acute Problems: ?Abnormal hearing screen ?Refer to audiology. ? ?Preventative Healthcare: ?Check labs. UTD on vaccines. Check cologuard again next year.  ? ?Patient Counseling(The following topics were reviewed and/or handout was given): ? -Nutrition: Stressed importance of moderation in sodium/caffeine intake, saturated fat and cholesterol, caloric balance, sufficient intake of fresh fruits, vegetables, and fiber. ? -Stressed the importance of regular exercise.  ? -Substance Abuse: Discussed cessation/primary prevention of tobacco, alcohol, or other drug use; driving or other dangerous activities under the influence; availability of treatment for abuse.  ? -Injury prevention: Discussed safety belts, safety helmets, smoke detector, smoking near bedding or upholstery.  ? -Sexuality: Discussed sexually transmitted diseases, partner selection, use of condoms, avoidance of unintended pregnancy and contraceptive alternatives.  ? -Dental health: Discussed importance of regular tooth brushing, flossing, and dental visits. ? -Health maintenance and immunizations reviewed. Please refer to Health maintenance section. ? ?Return to care in 1 year for next preventative visit.  ? ?  ?Subjective:  ?HPI: ? ?He has no acute complaints today.  His wife has been concerned that he has had decreased hearing.  He usually attributes this to not understanding what his wife is having.  He is not overly concerned with his hearing. ? ?Lifestyle ?Diet: Balanced. Gets plenty of fruits and vegetables. Intermittent fasting.  ?Exercise: Likes running.  ? ?Depression screen [facility] 2/9 10/04/2021  ?Decreased Interest 0  ?Down, Depressed, Hopeless 0  ?PHQ - 2 Score 0  ? ?Health Maintenance Due  ?Topic Date Due  ? Hepatitis C Screening  Never done  ? COVID-19 Vaccine (2 - Pfizer series) 08/06/2020  ?  ? ?ROS:  Per HPI, otherwise a complete review of systems was negative.  ? ?PMH: ? ?The following were reviewed and entered/updated in epic: ?History reviewed. No pertinent past medical history. ?There are no problems to display for this patient. ? ?History reviewed. No pertinent surgical history. ? ?Family History  ?Problem Relation Age of Onset  ? Sudden Cardiac Death Neg Hx   ? ? ?Medications- reviewed and updated ?No current outpatient medications on file.  ? ?No current facility-administered medications for this visit.  ? ? ?Allergies-reviewed and updated ?No Known Allergies ? ?Social History  ? ?Socioeconomic History  ? Marital status: Married  ?  Spouse name: Not on file  ? Number of children: Not on file  ? Years of education: Not on file  ? Highest education level: Not on file  ?Occupational History  ? Not on file  ?Tobacco Use  ? Smoking status: Never  ? Smokeless tobacco: Never  ?Substance and Sexual Activity  ? Alcohol use: Yes  ?  Comment: Occasionally  ? Drug use: No  ? Sexual activity: Yes  ?  Partners: Female  ?Other Topics Concern  ? Not on file  ?Social History Narrative  ? Not on file  ? ?Social Determinants of Health  ? ?Financial Resource Strain: Not on file  ?Food Insecurity: Not on file  ?Transportation Needs: Not on file  ?Physical Activity: Not on file  ?Stress: Not on file  ?Social Connections: Not on file  ? ?   ?  ?Objective:  ?Physical Exam: ?BP 129/84 (BP Location: Left Arm)   Pulse 63   Temp 98.1 ?F (36.7 ?C) (Temporal)   Ht 5' 8"  (1.727 m)   Wt 166 lb 9.6 oz (75.6 kg)  SpO2 99%   BMI 25.33 kg/m?   ?Body mass index is 25.33 kg/m?. ?Wt Readings from Last 3 Encounters:  ?10/04/21 166 lb 9.6 oz (75.6 kg)  ?09/30/20 158 lb 3.2 oz (71.8 kg)  ?06/08/20 159 lb 6.4 oz (72.3 kg)  ? ?Gen: NAD, resting comfortably ?HEENT: TMs normal bilaterally. OP clear. No thyromegaly noted.  ?CV: RRR with no murmurs appreciated ?Pulm: NWOB, CTAB with no crackles, wheezes, or rhonchi ?GI: Normal bowel sounds  present. Soft, Nontender, Nondistended. ?MSK: no edema, cyanosis, or clubbing noted ?Skin: warm, dry ?Neuro: CN2-12 grossly intact. Strength 5/5 in upper and lower extremities. Reflexes symmetric and intact bilaterally.  ?Psych: Normal affect and thought content ?   ? ?[name]. [name], MD ?10/04/2021 2:19 PM  ?

## 2021-10-04 NOTE — Patient Instructions (Signed)
It was very nice to see you today! ? ?We will check blood work today. ? ?Please continue the good work with your diet and exercise. ? ?We will see you back in a few next physical.  Come back sooner if needed. ? ?Take care, ?Dr [name] ? ?PLEASE NOTE: ? ?If you had any lab tests please let us know if you have not heard back within a few days. You may see your results on mychart before we have a chance to review them but we will give you a call once they are reviewed by [place]. If we ordered any referrals today, please let us know if you have not heard from their office within the next week.  ? ?Please try these tips to maintain a healthy lifestyle: ? ?Eat at least 3 REAL meals and 1-2 snacks per day.  Aim for no more than 5 hours between eating.  If you eat breakfast, please do so within one hour of getting up.  ? ?Each meal should contain half fruits/vegetables, one quarter protein, and one quarter carbs (no bigger than a computer mouse) ? ?Cut down on sweet beverages. This includes juice, soda, and sweet tea.  ? ?Drink at least 1 glass of water with each meal and aim for at least 8 glasses per day ? ?Exercise at least 150 minutes every week.   ? ?Preventive Care 13-63 Years Old, Male ?Preventive care refers to lifestyle choices and visits with your health care provider that can promote health and wellness. Preventive care visits are also called wellness exams. ?What can I expect for my preventive care visit? ?Counseling ?During your preventive care visit, your health care provider may ask about your: ?Medical history, including: ?Past medical problems. ?Family medical history. ?Current health, including: ?Emotional well-being. ?Home life and relationship well-being. ?Sexual activity. ?Lifestyle, including: ?Alcohol, nicotine or tobacco, and drug use. ?Access to firearms. ?Diet, exercise, and sleep habits. ?Safety issues such as seatbelt and bike helmet use. ?Sunscreen use. ?Work and work Statistician. ?Physical  exam ?Your health care provider will check your: ?Height and weight. These may be used to calculate your BMI (body mass index). BMI is a measurement that tells if you are at a healthy weight. ?Waist circumference. This measures the distance around your waistline. This measurement also tells if you are at a healthy weight and may help predict your risk of certain diseases, such as type 2 diabetes and high blood pressure. ?Heart rate and blood pressure. ?Body temperature. ?Skin for abnormal spots. ?What immunizations do I need? ?Vaccines are usually given at various ages, according to a schedule. Your health care provider will recommend vaccines for you based on your age, medical history, and lifestyle or other factors, such as travel or where you work. ?What tests do I need? ?Screening ?Your health care provider may recommend screening tests for certain conditions. This may include: ?Lipid and cholesterol levels. ?Diabetes screening. This is done by checking your blood sugar (glucose) after you have not eaten for a while (fasting). ?Hepatitis B test. ?Hepatitis C test. ?HIV (human immunodeficiency virus) test. ?STI (sexually transmitted infection) testing, if you are at risk. ?Lung cancer screening. ?Prostate cancer screening. ?Colorectal cancer screening. ?Talk with your health care provider about your test results, treatment options, and if necessary, the need for more tests. ?Follow these instructions at home: ?Eating and drinking ? ?Eat a diet that includes fresh fruits and vegetables, whole grains, lean protein, and low-fat dairy products. ?Take vitamin and mineral supplements as recommended  by your health care provider. ?Do not drink alcohol if your health care provider tells you not to drink. ?If you drink alcohol: ?Limit how much you have to 0-2 drinks a day. ?Know how much alcohol is in your drink. In the [place] one drink equals one 12 oz bottle of beer (355 mL), one 5 oz glass of wine (148 mL), or one 1? oz  glass of hard liquor (44 mL). ?Lifestyle ?Brush your teeth every morning and night with fluoride toothpaste. Floss one time each day. ?Exercise for at least 30 minutes 5 or more days each week. ?Do not use any products that contain nicotine or tobacco. These products include cigarettes, chewing tobacco, and vaping devices, such as e-cigarettes. If you need help quitting, ask your health care provider. ?Do not use drugs. ?If you are sexually active, practice safe sex. Use a condom or other form of protection to prevent STIs. ?Take aspirin only as told by your health care provider. Make sure that you understand how much to take and what form to take. Work with your health care provider to find out whether it is safe and beneficial for you to take aspirin daily. ?Find healthy ways to manage stress, such as: ?Meditation, yoga, or listening to music. ?Journaling. ?Talking to a trusted person. ?Spending time with friends and family. ?Minimize exposure to UV radiation to reduce your risk of skin cancer. ?Safety ?Always wear your seat belt while driving or riding in a vehicle. ?Do not drive: ?If you have been drinking alcohol. Do not ride with someone who has been drinking. ?When you are tired or distracted. ?While texting. ?If you have been using any mind-altering substances or drugs. ?Wear a helmet and other protective equipment during sports activities. ?If you have firearms in your house, make sure you follow all gun safety procedures. ?What's next? ?Go to your health care provider once a year for an annual wellness visit. ?Ask your health care provider how often you should have your eyes and teeth checked. ?Stay up to date on all vaccines. ?This information is not intended to replace advice given to you by your health care provider. Make sure you discuss any questions you have with your health care provider. ?Document Revised: 12/30/2020 Document Reviewed: 12/30/2020 ?Elsevier Patient Education ? [place]. ? ?

## 2021-10-05 LAB — CBC
HCT: 42.5 % (ref 39.0–52.0)
Hemoglobin: 14.6 g/dL (ref 13.0–17.0)
MCHC: 34.4 g/dL (ref 30.0–36.0)
MCV: 96.4 fl (ref 78.0–100.0)
Platelets: 211 10*3/uL (ref 150.0–400.0)
RBC: 4.41 Mil/uL (ref 4.22–5.81)
RDW: 12.2 % (ref 11.5–15.5)
WBC: 5.4 10*3/uL (ref 4.0–10.5)

## 2021-10-05 LAB — COMPREHENSIVE METABOLIC PANEL
ALT: 18 U/L (ref 0–53)
AST: 26 U/L (ref 0–37)
Albumin: 4.9 g/dL (ref 3.5–5.2)
Alkaline Phosphatase: 41 U/L (ref 39–117)
BUN: 16 mg/dL (ref 6–23)
CO2: 23 mEq/L (ref 19–32)
Calcium: 9.8 mg/dL (ref 8.4–10.5)
Chloride: 101 mEq/L (ref 96–112)
Creatinine, Ser: 0.85 mg/dL (ref 0.40–1.50)
GFR: 103.28 mL/min (ref >60.00)
Glucose, Bld: 80 mg/dL (ref 70–99)
Potassium: 4.4 mEq/L (ref 3.5–5.1)
Sodium: 139 mEq/L (ref 135–145)
Total Bilirubin: 0.9 mg/dL (ref 0.2–1.2)
Total Protein: 7.2 g/dL (ref 6.0–8.3)

## 2021-10-05 LAB — LIPID PANEL
Cholesterol: 193 mg/dL (ref 0–200)
HDL: 63.3 mg/dL (ref >39.00)
LDL Cholesterol: 111 mg/dL — ABNORMAL HIGH (ref 0–99)
NonHDL: 129.85
Total CHOL/HDL Ratio: 3
Triglycerides: 96 mg/dL (ref 0.0–149.0)
VLDL: 19.2 mg/dL (ref 0.0–40.0)

## 2021-10-05 LAB — TSH: TSH: 0.54 u[IU]/mL (ref 0.35–5.50)

## 2021-10-06 LAB — HEPATITIS C ANTIBODY
Hepatitis C Ab: NONREACTIVE
SIGNAL TO CUT-OFF: 0.05 (ref <1.00)

## 2021-10-07 NOTE — Progress Notes (Signed)
Please inform patient of the following: ? ?His "bad" cholesterol is slightly elevated but everything else is stable. Do not need to make any adjustments to his treatment plan at this time. Would like for him to keep working on diet and exercise and we can recheck in a year. ? ?Cole Griffin. [name], MD ?10/07/2021 12:50 PM  ?

## 2021-10-12 ENCOUNTER — Ambulatory Visit: Payer: 59 | Attending: Family Medicine | Admitting: Audiology

## 2021-10-12 ENCOUNTER — Other Ambulatory Visit: Payer: Self-pay

## 2021-10-12 DIAGNOSIS — H903 Sensorineural hearing loss, bilateral: Secondary | ICD-10-CM | POA: Insufficient documentation

## 2021-10-12 NOTE — Procedures (Signed)
?  [facility] ?[address] ?[place], [place]  [address] ?[phone] ? ?AUDIOLOGICAL  EVALUATION ? ?NAME: Cole Griffin     ?DOB:   [birth year]      ?MRN: [id number]                                                                                     ?DATE: 10/12/2021     ?REFERENT: Vivi Barrack, MD ?STATUS: Outpatient ?DIAGNOSIS: Sensorineural hearing loss, bilateral   ? ?History: ?Leshaun was seen for an audiological evaluation due to decreased hearing. He was referred after failing a hearing screening at his physical. Stephone denies hearing concerns, denies otalgia, aural fullness, tinnitus, and dizziness. Kaniel reports increased difficulty communicating with his wife.  ? ?Evaluation:  ?Otoscopy showed a clear view of the tympanic membranes, bilaterally ?Tympanometry results were consistent with normal middle ear pressure and normal tympanic membrane mobility (Type A), bilaterally.  ?Audiometric testing was completed using Conventional Audiometry techniques with insert earphones and TDH headphones. Test results are consistent with normal hearing sensitivity [phone] Hz sloping to a mild sensorineural hearing loss 1500-8000 Hz, bilaterally. Sensorineural asymmetry noted at 2000-4000 Hz worse in the left ear.  Speech Recognition Thresholds were obtained at 20 dB HL in the right ear and at 20  dB HL in the left ear. Word Recognition Testing was completed at 70 dB HL and Arran scored 92%, bilaterally.   ? ?Results:  ?The test results and recommendations were reviewed with [place]. Today's test results are consistent with normal hearing sensitivity [phone] Hz sloping to a mild sensorineural hearing loss 1500-8000 Hz, bilaterally. Sensorineural asymmetry noted at 2000-4000 Hz worse in the left ear. Serafino will have communication difficulty in many listening environments. He will benefit from the use of good communication strategies and amplification if motivated. A referral to an ENT  was reviewed due to asymmetric hearing loss.  ? ?Recommendations: ?Referral to an Ear, Nose, and Throat Physician due to sensorineural asymmetry.  ?Communication Needs Assessment with an Audiologist to discuss hearing aids if motivated.  ?Monitor hearing sensitivity. Return in 2 years for another audiological evaluation.  ? ?  ?If you have any questions please feel free to contact me at [phone]. ? ?Bari Mantis ?Audiologist, Au.D., CCC-A ?10/12/2021  2:08 PM ? ?Cc: Vivi Barrack, MD ? ?

## 2022-04-11 ENCOUNTER — Encounter: Payer: Self-pay | Admitting: *Deleted

## 2022-06-30 ENCOUNTER — Encounter: Payer: Self-pay | Admitting: *Deleted

## 2022-10-06 ENCOUNTER — Encounter: Payer: 59 | Admitting: Family Medicine

## 2022-11-04 ENCOUNTER — Encounter: Payer: Self-pay | Admitting: Family Medicine

## 2022-11-04 ENCOUNTER — Ambulatory Visit (INDEPENDENT_AMBULATORY_CARE_PROVIDER_SITE_OTHER): Payer: BC Managed Care – PPO | Admitting: Family Medicine

## 2022-11-04 VITALS — BP 105/73 | HR 64 | Temp 97.8°F | Resp 16 | Ht 68.0 in | Wt 160.8 lb

## 2022-11-04 DIAGNOSIS — M79641 Pain in right hand: Secondary | ICD-10-CM | POA: Diagnosis not present

## 2022-11-04 DIAGNOSIS — Z0001 Encounter for general adult medical examination with abnormal findings: Secondary | ICD-10-CM

## 2022-11-04 DIAGNOSIS — Z131 Encounter for screening for diabetes mellitus: Secondary | ICD-10-CM

## 2022-11-04 DIAGNOSIS — M79643 Pain in unspecified hand: Secondary | ICD-10-CM | POA: Insufficient documentation

## 2022-11-04 DIAGNOSIS — Z1211 Encounter for screening for malignant neoplasm of colon: Secondary | ICD-10-CM | POA: Diagnosis not present

## 2022-11-04 DIAGNOSIS — E785 Hyperlipidemia, unspecified: Secondary | ICD-10-CM | POA: Diagnosis not present

## 2022-11-04 NOTE — Assessment & Plan Note (Signed)
Discussed lifestyle modifications.  Check lipids when he comes back in for labs.

## 2022-11-04 NOTE — Patient Instructions (Addendum)
It was very nice to see you today!  We will order your cologuard.  Please keep working on diet and exercise.   Please come back soon for your labs.   Return in about 1 year (around 11/04/2023) for Annual Physical.   Take care, Dr [name]  PLEASE NOTE:  If you had any lab tests, please let us know if you have not heard back within a few days. You may see your results on mychart before we have a chance to review them but we will give you a call once they are reviewed by [place].   If we ordered any referrals today, please let us know if you have not heard from their office within the next week.   If you had any urgent prescriptions sent in today, please check with the pharmacy within an hour of our visit to make sure the prescription was transmitted appropriately.   Please try these tips to maintain a healthy lifestyle:  Eat at least 3 REAL meals and 1-2 snacks per day.  Aim for no more than 5 hours between eating.  If you eat breakfast, please do so within one hour of getting up.   Each meal should contain half fruits/vegetables, one quarter protein, and one quarter carbs (no bigger than a computer mouse)  Cut down on sweet beverages. This includes juice, soda, and sweet tea.   Drink at least 1 glass of water with each meal and aim for at least 8 glasses per day  Exercise at least 150 minutes every week.    Preventive Care 35-69 Years Old, Male Preventive care refers to lifestyle choices and visits with your health care provider that can promote health and wellness. Preventive care visits are also called wellness exams. What can I expect for my preventive care visit? Counseling During your preventive care visit, your health care provider may ask about your: Medical history, including: Past medical problems. Family medical history. Current health, including: Emotional well-being. Home life and relationship well-being. Sexual activity. Lifestyle, including: Alcohol, nicotine or  tobacco, and drug use. Access to firearms. Diet, exercise, and sleep habits. Safety issues such as seatbelt and bike helmet use. Sunscreen use. Work and work Astronomer. Physical exam Your health care provider will check your: Height and weight. These may be used to calculate your BMI (body mass index). BMI is a measurement that tells if you are at a healthy weight. Waist circumference. This measures the distance around your waistline. This measurement also tells if you are at a healthy weight and may help predict your risk of certain diseases, such as type 2 diabetes and high blood pressure. Heart rate and blood pressure. Body temperature. Skin for abnormal spots. What immunizations do I need?  Vaccines are usually given at various ages, according to a schedule. Your health care provider will recommend vaccines for you based on your age, medical history, and lifestyle or other factors, such as travel or where you work. What tests do I need? Screening Your health care provider may recommend screening tests for certain conditions. This may include: Lipid and cholesterol levels. Diabetes screening. This is done by checking your blood sugar (glucose) after you have not eaten for a while (fasting). Hepatitis B test. Hepatitis C test. HIV (human immunodeficiency virus) test. STI (sexually transmitted infection) testing, if you are at risk. Lung cancer screening. Prostate cancer screening. Colorectal cancer screening. Talk with your health care provider about your test results, treatment options, and if necessary, the need for more  tests. Follow these instructions at home: Eating and drinking  Eat a diet that includes fresh fruits and vegetables, whole grains, lean protein, and low-fat dairy products. Take vitamin and mineral supplements as recommended by your health care provider. Do not drink alcohol if your health care provider tells you not to drink. If you drink alcohol: Limit how  much you have to 0-2 drinks a day. Know how much alcohol is in your drink. In the [place] one drink equals one 12 oz bottle of beer (355 mL), one 5 oz glass of wine (148 mL), or one 1 oz glass of hard liquor (44 mL). Lifestyle Brush your teeth every morning and night with fluoride toothpaste. Floss one time each day. Exercise for at least 30 minutes 5 or more days each week. Do not use any products that contain nicotine or tobacco. These products include cigarettes, chewing tobacco, and vaping devices, such as e-cigarettes. If you need help quitting, ask your health care provider. Do not use drugs. If you are sexually active, practice safe sex. Use a condom or other form of protection to prevent STIs. Take aspirin only as told by your health care provider. Make sure that you understand how much to take and what form to take. Work with your health care provider to find out whether it is safe and beneficial for you to take aspirin daily. Find healthy ways to manage stress, such as: Meditation, yoga, or listening to music. Journaling. Talking to a trusted person. Spending time with friends and family. Minimize exposure to UV radiation to reduce your risk of skin cancer. Safety Always wear your seat belt while driving or riding in a vehicle. Do not drive: If you have been drinking alcohol. Do not ride with someone who has been drinking. When you are tired or distracted. While texting. If you have been using any mind-altering substances or drugs. Wear a helmet and other protective equipment during sports activities. If you have firearms in your house, make sure you follow all gun safety procedures. What's next? Go to your health care provider once a year for an annual wellness visit. Ask your health care provider how often you should have your eyes and teeth checked. Stay up to date on all vaccines. This information is not intended to replace advice given to you by your health care provider.  Make sure you discuss any questions you have with your health care provider. Document Revised: 12/30/2020 Document Reviewed: 12/30/2020 Elsevier Patient Education  [place].

## 2022-11-04 NOTE — Progress Notes (Signed)
Chief Complaint:  Cole Griffin is a 47 y.o. male who presents today for his annual comprehensive physical exam.    Assessment/Plan:  Chronic Problems Addressed Today: Hand pain Likely related to his remote injury decades ago.  Discussed with patient that he may have a small amount of retained foreign body or scar tissue causing his symptoms. However given that this has been present for at least 35 years, do not think surgery would be beneficial even if he does have a foreign body.  We did discuss further imaging with x-ray however he deferred.  Will continue with watchful waiting.  He will let us know if symptoms worsen or change and would consider referral to orthopedics or obtaining imaging at that time.  Dyslipidemia Discussed lifestyle modifications.  Check lipids when he comes back in for labs.  Preventative Healthcare: Will order Cologuard.  He will come back for labs.  He can get tetanus at the pharmacy.  Patient Counseling(The following topics were reviewed and/or handout was given):  -Nutrition: Stressed importance of moderation in sodium/caffeine intake, saturated fat and cholesterol, caloric balance, sufficient intake of fresh fruits, vegetables, and fiber.  -Stressed the importance of regular exercise.   -Substance Abuse: Discussed cessation/primary prevention of tobacco, alcohol, or other drug use; driving or other dangerous activities under the influence; availability of treatment for abuse.   -Injury prevention: Discussed safety belts, safety helmets, smoke detector, smoking near bedding or upholstery.   -Sexuality: Discussed sexually transmitted diseases, partner selection, use of condoms, avoidance of unintended pregnancy and contraceptive alternatives.   -Dental health: Discussed importance of regular tooth brushing, flossing, and dental visits.  -Health maintenance and immunizations reviewed. Please refer to Health maintenance section.  Return to care in 1 year for  next preventative visit.     Subjective:  HPI:  He has no acute complaints today.   He does have some pain in his right hand. This has been intermittent for decades. He did have an injury when he was 47 years old where a glass cup shattered and cut the back of hand. This healed normally but since then occasionally gets a sharp sensation of pain where his precious injury was.   Lifestyle Diet: Balanced. Plenty of fruits and vegetables.  Exercise: Likes running.      11/04/2022    8:19 AM  Depression screen PHQ 2/9  Decreased Interest 0  Down, Depressed, Hopeless 0  PHQ - 2 Score 0    Health Maintenance Due  Topic Date Due   DTaP/Tdap/Td (2 - Td or Tdap) 07/28/2021   COVID-19 Vaccine (4 - 2023-24 season) 03/18/2022   Fecal DNA (Cologuard)  10/15/2022     ROS: Per HPI, otherwise a complete review of systems was negative.   PMH:  The following were reviewed and entered/updated in epic: History reviewed. No pertinent past medical history. Patient Active Problem List   Diagnosis Date Noted   Hand pain 11/04/2022   Dyslipidemia 11/04/2022   History reviewed. No pertinent surgical history.  Family History  Problem Relation Age of Onset   Sudden Cardiac Death Neg Hx     Medications- reviewed and updated No current outpatient medications on file.   No current facility-administered medications for this visit.    Allergies-reviewed and updated No Known Allergies  Social History   Socioeconomic History   Marital status: Married    Spouse name: Not on file   Number of children: Not on file   Years of education: Not on file  Highest education level: Not on file  Occupational History   Not on file  Tobacco Use   Smoking status: Never   Smokeless tobacco: Never  Substance and Sexual Activity   Alcohol use: Yes    Comment: Occasionally   Drug use: No   Sexual activity: Yes    Partners: Female  Other Topics Concern   Not on file  Social History Narrative    Not on file   Social Determinants of Health   Financial Resource Strain: Not on file  Food Insecurity: Not on file  Transportation Needs: Not on file  Physical Activity: Not on file  Stress: Not on file  Social Connections: Not on file        Objective:  Physical Exam: BP 105/73   Pulse 64   Temp 97.8 F (36.6 C) (Temporal)   Resp 16   Ht  (1.727 m)   Wt 160 lb 12.8 oz (72.9 kg)   SpO2 100%   BMI 24.45 kg/m   Body mass index is 24.45 kg/m. Wt Readings from Last 3 Encounters:  11/04/22 160 lb 12.8 oz (72.9 kg)  10/04/21 166 lb 9.6 oz (75.6 kg)  09/30/20 158 lb 3.2 oz (71.8 kg)   Gen: NAD, resting comfortably HEENT: TMs normal bilaterally. OP clear. No thyromegaly noted.  CV: RRR with no murmurs appreciated Pulm: NWOB, CTAB with no crackles, wheezes, or rhonchi GI: Normal bowel sounds present. Soft, Nontender, Nondistended. MSK: no edema, cyanosis, or clubbing noted - Hand: Well-healed scarring noted along ulnar aspect of dorsal right hand. Skin: warm, dry Neuro: CN2-12 grossly intact. Strength 5/5 in upper and lower extremities. Reflexes symmetric and intact bilaterally.  Psych: Normal affect and thought content     [name] M. [name], MD 11/04/2022 8:54 AM

## 2022-11-04 NOTE — Assessment & Plan Note (Signed)
Likely related to his remote injury decades ago.  Discussed with patient that he may have a small amount of retained foreign body or scar tissue causing his symptoms. However given that this has been present for at least 35 years, do not think surgery would be beneficial even if he does have a foreign body.  We did discuss further imaging with x-ray however he deferred.  Will continue with watchful waiting.  He will let us know if symptoms worsen or change and would consider referral to orthopedics or obtaining imaging at that time.

## 2022-11-09 ENCOUNTER — Other Ambulatory Visit (INDEPENDENT_AMBULATORY_CARE_PROVIDER_SITE_OTHER): Payer: BC Managed Care – PPO

## 2022-11-09 DIAGNOSIS — Z131 Encounter for screening for diabetes mellitus: Secondary | ICD-10-CM

## 2022-11-09 DIAGNOSIS — E785 Hyperlipidemia, unspecified: Secondary | ICD-10-CM | POA: Diagnosis not present

## 2022-11-09 DIAGNOSIS — Z0001 Encounter for general adult medical examination with abnormal findings: Secondary | ICD-10-CM

## 2022-11-09 LAB — CBC
HCT: 44.4 % (ref 39.0–52.0)
Hemoglobin: 15.4 g/dL (ref 13.0–17.0)
MCHC: 34.7 g/dL (ref 30.0–36.0)
MCV: 95 fl (ref 78.0–100.0)
Platelets: 211 10*3/uL (ref 150.0–400.0)
RBC: 4.67 Mil/uL (ref 4.22–5.81)
RDW: 12.2 % (ref 11.5–15.5)
WBC: 5.6 10*3/uL (ref 4.0–10.5)

## 2022-11-09 LAB — LIPID PANEL
Cholesterol: 160 mg/dL (ref 0–200)
HDL: 52.4 mg/dL (ref >39.00)
LDL Cholesterol: 89 mg/dL (ref 0–99)
NonHDL: 107.37
Total CHOL/HDL Ratio: 3
Triglycerides: 91 mg/dL (ref 0.0–149.0)
VLDL: 18.2 mg/dL (ref 0.0–40.0)

## 2022-11-09 LAB — COMPREHENSIVE METABOLIC PANEL
ALT: 16 U/L (ref 0–53)
AST: 26 U/L (ref 0–37)
Albumin: 4.6 g/dL (ref 3.5–5.2)
Alkaline Phosphatase: 40 U/L (ref 39–117)
BUN: 19 mg/dL (ref 6–23)
CO2: 25 mEq/L (ref 19–32)
Calcium: 9.4 mg/dL (ref 8.4–10.5)
Chloride: 106 mEq/L (ref 96–112)
Creatinine, Ser: 0.97 mg/dL (ref 0.40–1.50)
GFR: 92.07 mL/min (ref >60.00)
Glucose, Bld: 96 mg/dL (ref 70–99)
Potassium: 4.3 mEq/L (ref 3.5–5.1)
Sodium: 140 mEq/L (ref 135–145)
Total Bilirubin: 0.5 mg/dL (ref 0.2–1.2)
Total Protein: 6.8 g/dL (ref 6.0–8.3)

## 2022-11-09 LAB — HEMOGLOBIN A1C: Hgb A1c MFr Bld: 5.3 % (ref 4.6–6.5)

## 2022-11-09 LAB — TSH: TSH: 0.85 u[IU]/mL (ref 0.35–5.50)

## 2022-11-11 NOTE — Progress Notes (Signed)
Great news! Labs are all normal. Do not need to make any changes to treatment plan at this time. He should continue to work on diet and exercise and we can recheck in a year.

## 2023-02-03 DIAGNOSIS — Z1211 Encounter for screening for malignant neoplasm of colon: Secondary | ICD-10-CM | POA: Diagnosis not present

## 2023-02-09 LAB — COLOGUARD: COLOGUARD: NEGATIVE

## 2023-02-13 NOTE — Progress Notes (Signed)
Great news! Cologuard is negative. We can repeat in 3 years.

## 2023-07-28 ENCOUNTER — Ambulatory Visit: Payer: 59 | Admitting: Family Medicine

## 2023-07-28 DIAGNOSIS — R350 Frequency of micturition: Secondary | ICD-10-CM | POA: Diagnosis not present

## 2023-07-28 LAB — URINALYSIS, ROUTINE W REFLEX MICROSCOPIC
Bilirubin Urine: NEGATIVE
Hgb urine dipstick: NEGATIVE
Ketones, ur: NEGATIVE
Leukocytes,Ua: NEGATIVE
Nitrite: NEGATIVE
Specific Gravity, Urine: 1.02 (ref 1.000–1.030)
Total Protein, Urine: NEGATIVE
Urine Glucose: NEGATIVE
Urobilinogen, UA: 0.2 (ref 0.0–1.0)
pH: 7 (ref 5.0–8.0)

## 2023-07-28 NOTE — Assessment & Plan Note (Signed)
 No red flags.  Likely BPH.  Symptoms are currently manageable - he is not interested in any interventions at this point and is primarily concerned with ruling out infection, cancer, inflammation, etc.Will check UA and urine culture.  We also did discuss PSA screening however he would like to have this done when he comes back for his physical in a couple of months.  He may be interested in seeing a urologist though we will wait on today's results first.

## 2023-07-28 NOTE — Progress Notes (Signed)
   Cole Griffin is a 48 y.o. male who presents today for an office visit.  Assessment/Plan:  Chronic Problems Addressed Today: Urinary frequency No red flags.  Likely BPH.  Symptoms are currently manageable - he is not interested in any interventions at this point and is primarily concerned with ruling out infection, cancer, inflammation, etc.Will check UA and urine culture.  We also did discuss PSA screening however he would like to have this done when he comes back for his physical in a couple of months.  He may be interested in seeing a urologist though we will wait on today's results first.       Subjective:  HPI:  See Assessment / plan for status of chronic conditions. He is here today for urinary frequency.  This has been going on for about a year. HE is having to urinate every couple of hours. No dysuria. No hematuria. No fevers or chills.        Objective:  Physical Exam: BP 115/78   Pulse 68   Temp (!) 97.5 F (36.4 C) (Temporal)   Ht 5' 8 (1.727 m)   Wt 163 lb 9.6 oz (74.2 kg)   SpO2 98%   BMI 24.88 kg/m   Gen: No acute distress, resting comfortably CV: Regular rate and rhythm with no murmurs appreciated Pulm: Normal work of breathing, clear to auscultation bilaterally with no crackles, wheezes, or rhonchi Neuro: Grossly normal, moves all extremities Psych: Normal affect and thought content      Iyanna Drummer M. Kennyth, MD 07/28/2023 11:12 AM

## 2023-07-28 NOTE — Patient Instructions (Signed)
 It was very nice to see you today!  We will check a urine sample today.  We will check a PSA when you come back in for blood work.  Return for Annual Physical.   Take care, Dr Kennyth  PLEASE NOTE:  If you had any lab tests, please let us  know if you have not heard back within a few days. You may see your results on mychart before we have a chance to review them but we will give you a call once they are reviewed by us .   If we ordered any referrals today, please let us  know if you have not heard from their office within the next week.   If you had any urgent prescriptions sent in today, please check with the pharmacy within an hour of our visit to make sure the prescription was transmitted appropriately.   Please try these tips to maintain a healthy lifestyle:  Eat at least 3 REAL meals and 1-2 snacks per day.  Aim for no more than 5 hours between eating.  If you eat breakfast, please do so within one hour of getting up.   Each meal should contain half fruits/vegetables, one quarter protein, and one quarter carbs (no bigger than a computer mouse)  Cut down on sweet beverages. This includes juice, soda, and sweet tea.   Drink at least 1 glass of water with each meal and aim for at least 8 glasses per day  Exercise at least 150 minutes every week.

## 2023-07-29 LAB — URINE CULTURE
MICRO NUMBER:: 15943312
Result:: NO GROWTH
SPECIMEN QUALITY:: ADEQUATE

## 2023-07-31 NOTE — Progress Notes (Signed)
 Is urine culture was negative.  No signs of UTI.  As we discussed at his office visit he may be having an enlarged prostate.  We can check his PSA when he comes back in.  He should let us know if his symptoms worsen.

## 2023-08-08 ENCOUNTER — Other Ambulatory Visit: Payer: Self-pay | Admitting: *Deleted

## 2023-08-08 DIAGNOSIS — R350 Frequency of micturition: Secondary | ICD-10-CM

## 2023-08-08 NOTE — Progress Notes (Signed)
Ok to place referral.  Cole Griffin. [name], MD 08/08/2023 3:16 PM

## 2023-11-06 ENCOUNTER — Encounter: Payer: BC Managed Care – PPO | Admitting: Family Medicine

## 2024-01-22 ENCOUNTER — Encounter: Admitting: Family Medicine

## 2024-01-29 ENCOUNTER — Encounter: Admitting: Family Medicine

## 2024-03-14 ENCOUNTER — Encounter: Admitting: Family Medicine

## 2024-05-17 ENCOUNTER — Ambulatory Visit: Admitting: Family Medicine

## 2024-05-17 VITALS — BP 94/70 | HR 64 | Temp 98.2°F | Resp 16 | Ht 67.0 in | Wt 164.8 lb

## 2024-05-17 DIAGNOSIS — Z23 Encounter for immunization: Secondary | ICD-10-CM | POA: Diagnosis not present

## 2024-05-17 DIAGNOSIS — Z0001 Encounter for general adult medical examination with abnormal findings: Secondary | ICD-10-CM

## 2024-05-17 DIAGNOSIS — E785 Hyperlipidemia, unspecified: Secondary | ICD-10-CM | POA: Diagnosis not present

## 2024-05-17 DIAGNOSIS — Z131 Encounter for screening for diabetes mellitus: Secondary | ICD-10-CM | POA: Diagnosis not present

## 2024-05-17 DIAGNOSIS — Z125 Encounter for screening for malignant neoplasm of prostate: Secondary | ICD-10-CM | POA: Diagnosis not present

## 2024-05-17 DIAGNOSIS — N529 Male erectile dysfunction, unspecified: Secondary | ICD-10-CM | POA: Diagnosis not present

## 2024-05-17 DIAGNOSIS — R350 Frequency of micturition: Secondary | ICD-10-CM

## 2024-05-17 LAB — COMPREHENSIVE METABOLIC PANEL WITH GFR
ALT: 27 U/L (ref 0–53)
AST: 25 U/L (ref 0–37)
Albumin: 4.9 g/dL (ref 3.5–5.2)
Alkaline Phosphatase: 49 U/L (ref 39–117)
BUN: 17 mg/dL (ref 6–23)
CO2: 27 meq/L (ref 19–32)
Calcium: 9.7 mg/dL (ref 8.4–10.5)
Chloride: 101 meq/L (ref 96–112)
Creatinine, Ser: 1.02 mg/dL (ref 0.40–1.50)
GFR: 85.76 mL/min (ref >60.00)
Glucose, Bld: 88 mg/dL (ref 70–99)
Potassium: 4.3 meq/L (ref 3.5–5.1)
Sodium: 138 meq/L (ref 135–145)
Total Bilirubin: 0.7 mg/dL (ref 0.2–1.2)
Total Protein: 7.3 g/dL (ref 6.0–8.3)

## 2024-05-17 LAB — LIPID PANEL
Cholesterol: 207 mg/dL — ABNORMAL HIGH (ref 0–200)
HDL: 62.8 mg/dL (ref >39.00)
LDL Cholesterol: 126 mg/dL — ABNORMAL HIGH (ref 0–99)
NonHDL: 144.07
Total CHOL/HDL Ratio: 3
Triglycerides: 91 mg/dL (ref 0.0–149.0)
VLDL: 18.2 mg/dL (ref 0.0–40.0)

## 2024-05-17 LAB — PSA: PSA: 5.28 ng/mL — ABNORMAL HIGH (ref 0.10–4.00)

## 2024-05-17 LAB — CBC
HCT: 44.3 % (ref 39.0–52.0)
Hemoglobin: 15.3 g/dL (ref 13.0–17.0)
MCHC: 34.6 g/dL (ref 30.0–36.0)
MCV: 94.8 fl (ref 78.0–100.0)
Platelets: 226 K/uL (ref 150.0–400.0)
RBC: 4.68 Mil/uL (ref 4.22–5.81)
RDW: 12.4 % (ref 11.5–15.5)
WBC: 5.9 K/uL (ref 4.0–10.5)

## 2024-05-17 LAB — HEMOGLOBIN A1C: Hgb A1c MFr Bld: 5.4 % (ref 4.6–6.5)

## 2024-05-17 LAB — TSH: TSH: 0.59 u[IU]/mL (ref 0.35–5.50)

## 2024-05-17 NOTE — Progress Notes (Signed)
 Chief Complaint:  Cole Griffin is a 48 y.o. male who presents today for his annual comprehensive physical exam.    Assessment/Plan:   Chronic Problems Addressed Today: Dyslipidemia Check lipids.  Discussed lifestyle modifications.  Urinary frequency He saw urology earlier this year for this.  Diagnosed with pelvic floor dysfunction.  Symptoms seem to be improving with conservative measures.  Erectile dysfunction Also discussed this with urology earlier this year.  Has sildenafil on hand but has not needed to use it frequently.   Preventative Healthcare: Check labs.  He will come back next week for pneumonia and shingles vaccines.  Up-to-date on colon cancer screening.  Patient Counseling(The following topics were reviewed and/or handout was given):  -Nutrition: Stressed importance of moderation in sodium/caffeine intake, saturated fat and cholesterol, caloric balance, sufficient intake of fresh fruits, vegetables, and fiber.  -Stressed the importance of regular exercise.   -Substance Abuse: Discussed cessation/primary prevention of tobacco, alcohol, or other drug use; driving or other dangerous activities under the influence; availability of treatment for abuse.   -Injury prevention: Discussed safety belts, safety helmets, smoke detector, smoking near bedding or upholstery.   -Sexuality: Discussed sexually transmitted diseases, partner selection, use of condoms, avoidance of unintended pregnancy and contraceptive alternatives.   -Dental health: Discussed importance of regular tooth brushing, flossing, and dental visits.  -Health maintenance and immunizations reviewed. Please refer to Health maintenance section.  Return to care in 1 year for next preventative visit.     Subjective:  HPI:  He has no acute complaints today. Patient is here today for his annual physical.  See assessment / plan for status of chronic conditions.  Discussed the use of AI scribe software for  clinical note transcription with the patient, who gave verbal consent to proceed.  History of Present Illness Cole Griffin is a 48 year old male who presents for an annual physical exam.  He is feeling well physically, mentally, and emotionally. He is mindful of his health, including diet and exercise.  Earlier this year, he visited a urologist who recommended yoga and prescribed sildenafil, which he found effective. He is not currently using the medication but has some left from the prescription. His last visit to the urologist was in March.  He takes Athletic Greens AG1 in the morning on an empty stomach and is not on any other supplements. He is conscious of his diet, limits alcohol intake, and manages stress with strategies discussed with his brother, who is a therapist.  He maintains an active lifestyle, running 30 miles a month and participating in races, achieving fifth place in his age group recently. He does not go to the gym but exercises at home.       05/17/2024   10:44 AM  Depression screen PHQ 2/9  Decreased Interest 0  Down, Depressed, Hopeless 0  PHQ - 2 Score 0    There are no preventive care reminders to display for this patient.    ROS: Per HPI, otherwise a complete review of systems was negative.   PMH:  The following were reviewed and entered/updated in epic: No past medical history on file. Patient Active Problem List   Diagnosis Date Noted   Erectile dysfunction 05/17/2024   Urinary frequency 07/28/2023   Hand pain 11/04/2022   Dyslipidemia 11/04/2022   No past surgical history on file.  Family History  Problem Relation Age of Onset   Sudden Cardiac Death Neg Hx     Medications- reviewed and updated Current Outpatient  Medications  Medication Sig Dispense Refill   sildenafil (VIAGRA) 100 MG tablet Take 100 mg by mouth daily as needed for erectile dysfunction.     No current facility-administered medications for this visit.     Allergies-reviewed and updated No Known Allergies  Social History   Socioeconomic History   Marital status: Married    Spouse name: Not on file   Number of children: Not on file   Years of education: Not on file   Highest education level: Not on file  Occupational History   Not on file  Tobacco Use   Smoking status: Never   Smokeless tobacco: Never  Substance and Sexual Activity   Alcohol use: Yes    Comment: Occasionally   Drug use: No   Sexual activity: Yes    Partners: Female  Other Topics Concern   Not on file  Social History Narrative   Not on file   Social Drivers of Health   Financial Resource Strain: Not on file  Food Insecurity: Not on file  Transportation Needs: Not on file  Physical Activity: Not on file  Stress: Not on file  Social Connections: Not on file        Objective:  Physical Exam: BP 94/70   Pulse 64   Temp 98.2 F (36.8 C) (Temporal)   Resp 16   Ht 5' 7 (1.702 m)   Wt 164 lb 12.8 oz (74.8 kg)   SpO2 97%   BMI 25.81 kg/m   Body mass index is 25.81 kg/m. Wt Readings from Last 3 Encounters:  05/17/24 164 lb 12.8 oz (74.8 kg)  07/28/23 163 lb 9.6 oz (74.2 kg)  11/04/22 160 lb 12.8 oz (72.9 kg)   Gen: NAD, resting comfortably HEENT: TMs normal bilaterally. OP clear. No thyromegaly noted.  CV: RRR with no murmurs appreciated Pulm: NWOB, CTAB with no crackles, wheezes, or rhonchi GI: Normal bowel sounds present. Soft, Nontender, Nondistended. MSK: no edema, cyanosis, or clubbing noted Skin: warm, dry Neuro: CN2-12 grossly intact. Strength 5/5 in upper and lower extremities. Reflexes symmetric and intact bilaterally.  Psych: Normal affect and thought content     Jelisha Weed M. Kennyth, MD 05/17/2024 11:12 AM

## 2024-05-17 NOTE — Assessment & Plan Note (Signed)
 He saw urology earlier this year for this.  Diagnosed with pelvic floor dysfunction.  Symptoms seem to be improving with conservative measures.

## 2024-05-17 NOTE — Assessment & Plan Note (Signed)
 Also discussed this with urology earlier this year.  Has sildenafil on hand but has not needed to use it frequently.

## 2024-05-17 NOTE — Assessment & Plan Note (Signed)
 Check lipids. Discussed lifestyle modifications.

## 2024-05-17 NOTE — Patient Instructions (Signed)
 It was very nice to see you today!  VISIT SUMMARY: Today, you had your annual physical exam. You are in good health and are managing your diet, exercise, and stress well. We discussed your current treatment for erectile dysfunction and general health maintenance, including vaccinations.  YOUR PLAN: ERECTILE DYSFUNCTION: You have been managing this condition with sildenafil, which you use as needed and have found effective. -Continue using sildenafil as needed.  GENERAL HEALTH MAINTENANCE: You are in good health, engaging in regular physical activity, and monitoring your diet and alcohol intake. Your blood pressure and weight are well-controlled. -Order blood work. -Schedule a nurse visit for shingles and pneumonia vaccines. -Continue your physical activity and healthy diet. -Moderate your alcohol consumption.  Return in about 1 year (around 05/17/2025).   Take care, Dr Kennyth  PLEASE NOTE:  If you had any lab tests, please let us  know if you have not heard back within a few days. You may see your results on mychart before we have a chance to review them but we will give you a call once they are reviewed by us .   If we ordered any referrals today, please let us  know if you have not heard from their office within the next week.   If you had any urgent prescriptions sent in today, please check with the pharmacy within an hour of our visit to make sure the prescription was transmitted appropriately.   Please try these tips to maintain a healthy lifestyle:  Eat at least 3 REAL meals and 1-2 snacks per day.  Aim for no more than 5 hours between eating.  If you eat breakfast, please do so within one hour of getting up.   Each meal should contain half fruits/vegetables, one quarter protein, and one quarter carbs (no bigger than a computer mouse)  Cut down on sweet beverages. This includes juice, soda, and sweet tea.   Drink at least 1 glass of water with each meal and aim for at least 8  glasses per day  Exercise at least 150 minutes every week.    Preventive Care 43-53 Years Old, Male Preventive care refers to lifestyle choices and visits with your health care provider that can promote health and wellness. Preventive care visits are also called wellness exams. What can I expect for my preventive care visit? Counseling During your preventive care visit, your health care provider may ask about your: Medical history, including: Past medical problems. Family medical history. Current health, including: Emotional well-being. Home life and relationship well-being. Sexual activity. Lifestyle, including: Alcohol, nicotine or tobacco, and drug use. Access to firearms. Diet, exercise, and sleep habits. Safety issues such as seatbelt and bike helmet use. Sunscreen use. Work and work astronomer. Physical exam Your health care provider will check your: Height and weight. These may be used to calculate your BMI (body mass index). BMI is a measurement that tells if you are at a healthy weight. Waist circumference. This measures the distance around your waistline. This measurement also tells if you are at a healthy weight and may help predict your risk of certain diseases, such as type 2 diabetes and high blood pressure. Heart rate and blood pressure. Body temperature. Skin for abnormal spots. What immunizations do I need?  Vaccines are usually given at various ages, according to a schedule. Your health care provider will recommend vaccines for you based on your age, medical history, and lifestyle or other factors, such as travel or where you work. What tests do I  need? Screening Your health care provider may recommend screening tests for certain conditions. This may include: Lipid and cholesterol levels. Diabetes screening. This is done by checking your blood sugar (glucose) after you have not eaten for a while (fasting). Hepatitis B test. Hepatitis C test. HIV (human  immunodeficiency virus) test. STI (sexually transmitted infection) testing, if you are at risk. Lung cancer screening. Prostate cancer screening. Colorectal cancer screening. Talk with your health care provider about your test results, treatment options, and if necessary, the need for more tests. Follow these instructions at home: Eating and drinking  Eat a diet that includes fresh fruits and vegetables, whole grains, lean protein, and low-fat dairy products. Take vitamin and mineral supplements as recommended by your health care provider. Do not drink alcohol if your health care provider tells you not to drink. If you drink alcohol: Limit how much you have to 0-2 drinks a day. Know how much alcohol is in your drink. In the [place] one drink equals one 12 oz bottle of beer (355 mL), one 5 oz glass of wine (148 mL), or one 1 oz glass of hard liquor (44 mL). Lifestyle Brush your teeth every morning and night with fluoride toothpaste. Floss one time each day. Exercise for at least 30 minutes 5 or more days each week. Do not use any products that contain nicotine or tobacco. These products include cigarettes, chewing tobacco, and vaping devices, such as e-cigarettes. If you need help quitting, ask your health care provider. Do not use drugs. If you are sexually active, practice safe sex. Use a condom or other form of protection to prevent STIs. Take aspirin only as told by your health care provider. Make sure that you understand how much to take and what form to take. Work with your health care provider to find out whether it is safe and beneficial for you to take aspirin daily. Find healthy ways to manage stress, such as: Meditation, yoga, or listening to music. Journaling. Talking to a trusted person. Spending time with friends and family. Minimize exposure to UV radiation to reduce your risk of skin cancer. Safety Always wear your seat belt while driving or riding in a vehicle. Do not  drive: If you have been drinking alcohol. Do not ride with someone who has been drinking. When you are tired or distracted. While texting. If you have been using any mind-altering substances or drugs. Wear a helmet and other protective equipment during sports activities. If you have firearms in your house, make sure you follow all gun safety procedures. What's next? Go to your health care provider once a year for an annual wellness visit. Ask your health care provider how often you should have your eyes and teeth checked. Stay up to date on all vaccines. This information is not intended to replace advice given to you by your health care provider. Make sure you discuss any questions you have with your health care provider. Document Revised: 12/30/2020 Document Reviewed: 12/30/2020 Elsevier Patient Education  2024 [employer].

## 2024-05-21 ENCOUNTER — Ambulatory Visit: Payer: Self-pay | Admitting: Family Medicine

## 2024-05-21 NOTE — Progress Notes (Signed)
 PSA is elevated.  This may be related to large prostate though recommend he discuss further with urology.  Please make sure that he has a follow-up visit scheduled with them.  His cholesterol levels are also a little elevated.  Do not need to start meds for this specifically but is important he continue to work on diet and exercise and we can recheck this again in a year or so.  All of his other labs are at goal and we can recheck next year.

## 2024-05-23 ENCOUNTER — Ambulatory Visit

## 2025-05-19 ENCOUNTER — Encounter: Admitting: Family Medicine
